# Patient Record
Sex: Female | Born: 1955 | Race: White | Hispanic: No | Marital: Single | Smoking: Current every day smoker
Health system: Southern US, Community
[De-identification: ages and names within clinical notes are randomized; demographics above are authoritative.]

## PROBLEM LIST (undated history)

## (undated) DIAGNOSIS — K5909 Other constipation: Secondary | ICD-10-CM

## (undated) DIAGNOSIS — I10 Essential (primary) hypertension: Secondary | ICD-10-CM

## (undated) HISTORY — PX: TUBAL LIGATION: SHX77

---

## 1999-09-18 ENCOUNTER — Other Ambulatory Visit: Admission: RE | Admit: 1999-09-18 | Discharge: 1999-09-18 | Payer: Self-pay | Admitting: Obstetrics and Gynecology

## 1999-09-25 ENCOUNTER — Encounter: Payer: Self-pay | Admitting: Obstetrics and Gynecology

## 1999-09-25 ENCOUNTER — Ambulatory Visit (HOSPITAL_COMMUNITY): Admission: RE | Admit: 1999-09-25 | Discharge: 1999-09-25 | Payer: Self-pay | Admitting: Obstetrics and Gynecology

## 1999-10-01 ENCOUNTER — Encounter: Admission: RE | Admit: 1999-10-01 | Discharge: 1999-10-01 | Payer: Self-pay | Admitting: Obstetrics and Gynecology

## 1999-10-01 ENCOUNTER — Encounter: Payer: Self-pay | Admitting: Obstetrics and Gynecology

## 2003-10-14 ENCOUNTER — Inpatient Hospital Stay (HOSPITAL_COMMUNITY): Admission: EM | Admit: 2003-10-14 | Discharge: 2003-10-15 | Payer: Self-pay | Admitting: Emergency Medicine

## 2006-02-12 ENCOUNTER — Encounter: Admission: RE | Admit: 2006-02-12 | Discharge: 2006-02-12 | Payer: Self-pay | Admitting: Family Medicine

## 2006-02-18 ENCOUNTER — Ambulatory Visit: Payer: Self-pay | Admitting: Gastroenterology

## 2006-02-19 ENCOUNTER — Ambulatory Visit (HOSPITAL_COMMUNITY): Admission: RE | Admit: 2006-02-19 | Discharge: 2006-02-19 | Payer: Self-pay | Admitting: Gastroenterology

## 2006-02-23 ENCOUNTER — Ambulatory Visit: Payer: Self-pay | Admitting: Gastroenterology

## 2006-02-25 ENCOUNTER — Ambulatory Visit: Payer: Self-pay | Admitting: Internal Medicine

## 2006-03-11 ENCOUNTER — Ambulatory Visit (HOSPITAL_BASED_OUTPATIENT_CLINIC_OR_DEPARTMENT_OTHER): Admission: RE | Admit: 2006-03-11 | Discharge: 2006-03-11 | Payer: Self-pay | Admitting: Urology

## 2006-03-26 ENCOUNTER — Encounter: Admission: RE | Admit: 2006-03-26 | Discharge: 2006-03-26 | Payer: Self-pay | Admitting: Family Medicine

## 2006-04-14 ENCOUNTER — Encounter: Admission: RE | Admit: 2006-04-14 | Discharge: 2006-04-14 | Payer: Self-pay | Admitting: Family Medicine

## 2006-12-28 IMAGING — CR DG THORACIC SPINE 3V
3 series · 3 of 3 positions shown · non-contrast
Comparison: None.

THORACIC SPINE - 2  VIEW:

CLINICAL DATA: Back pain

[view not recorded (1 of 3)]
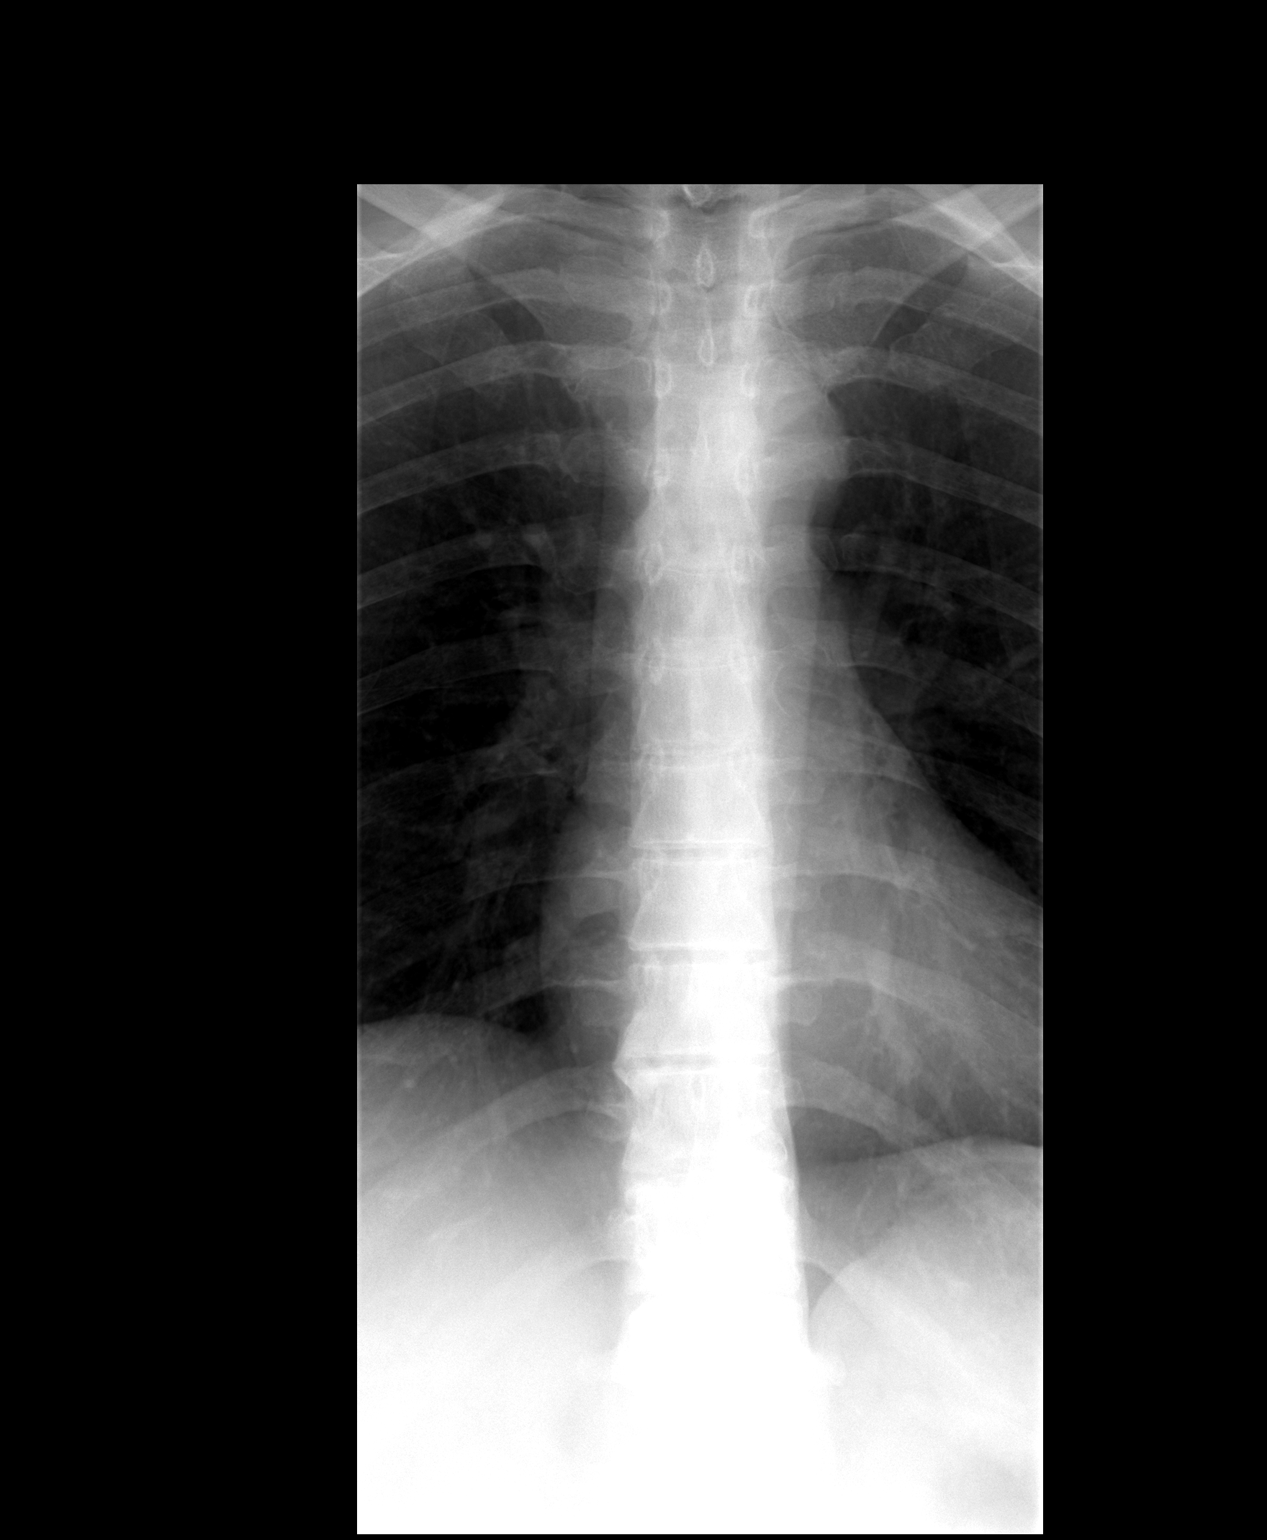

[view not recorded (2 of 3)]
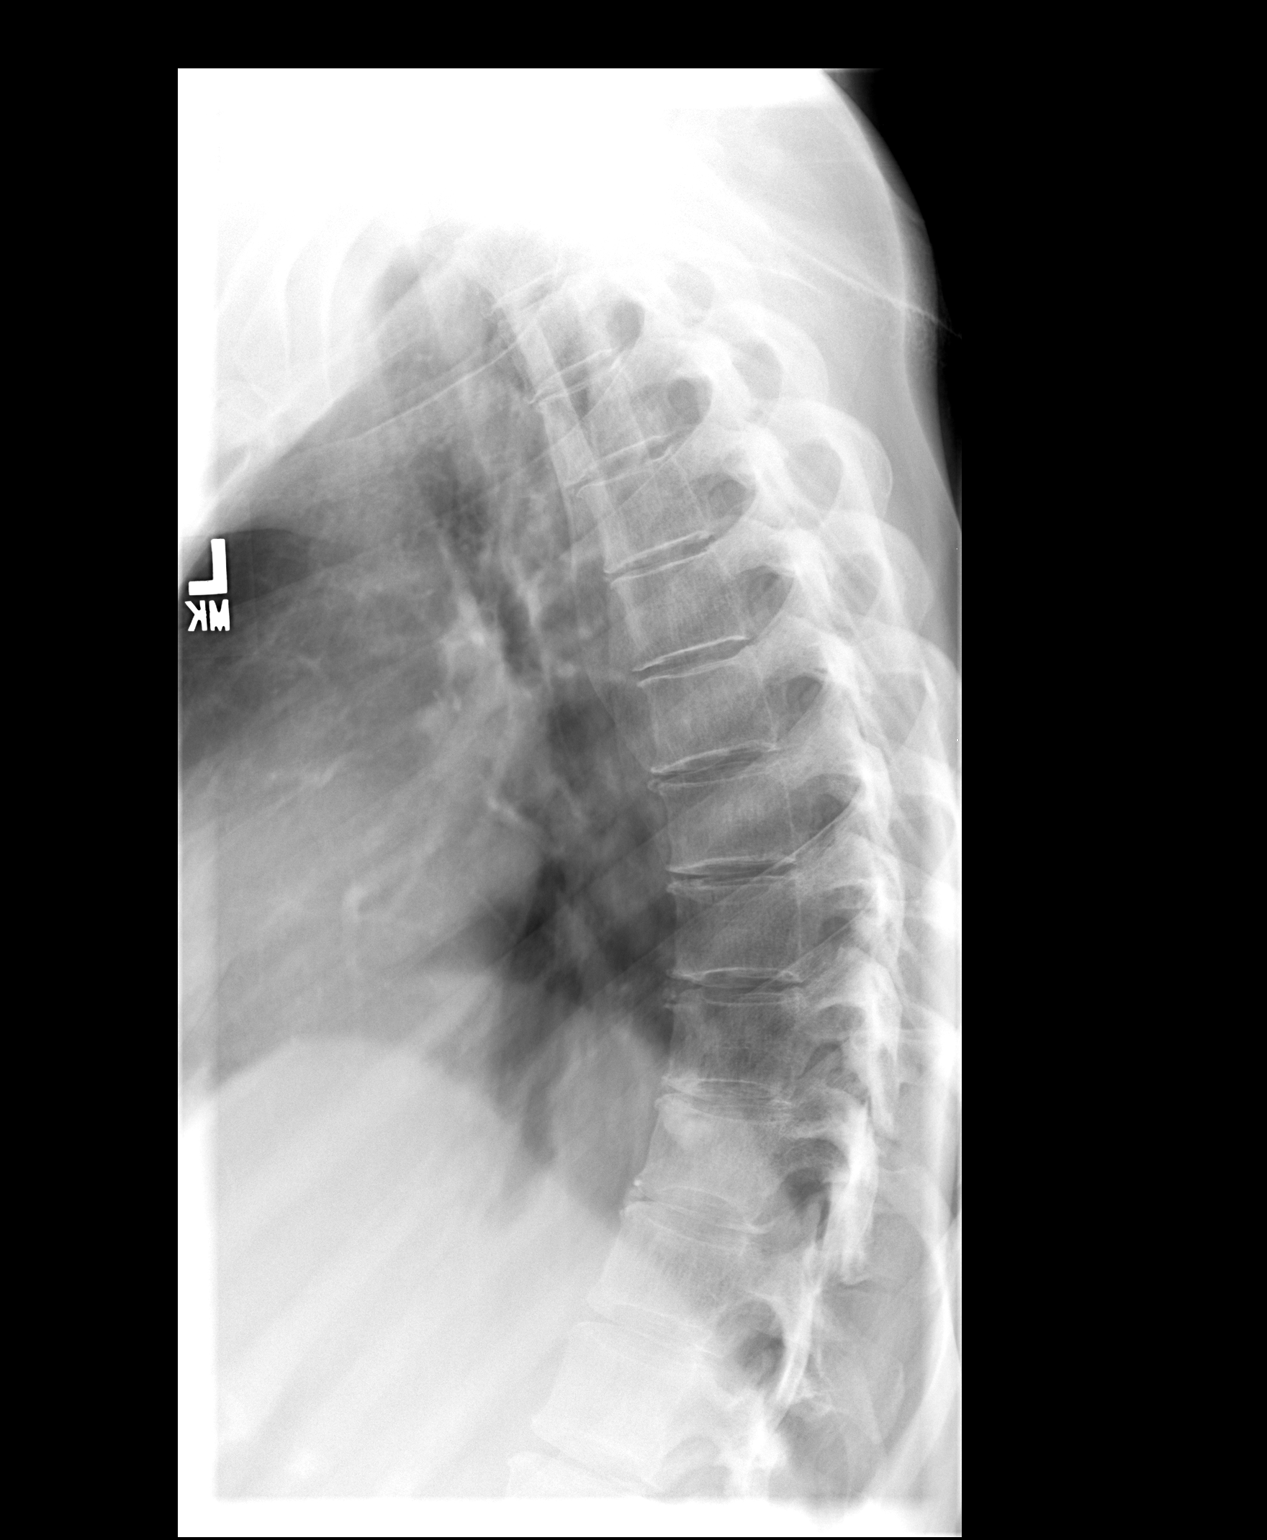

[view not recorded (3 of 3)]
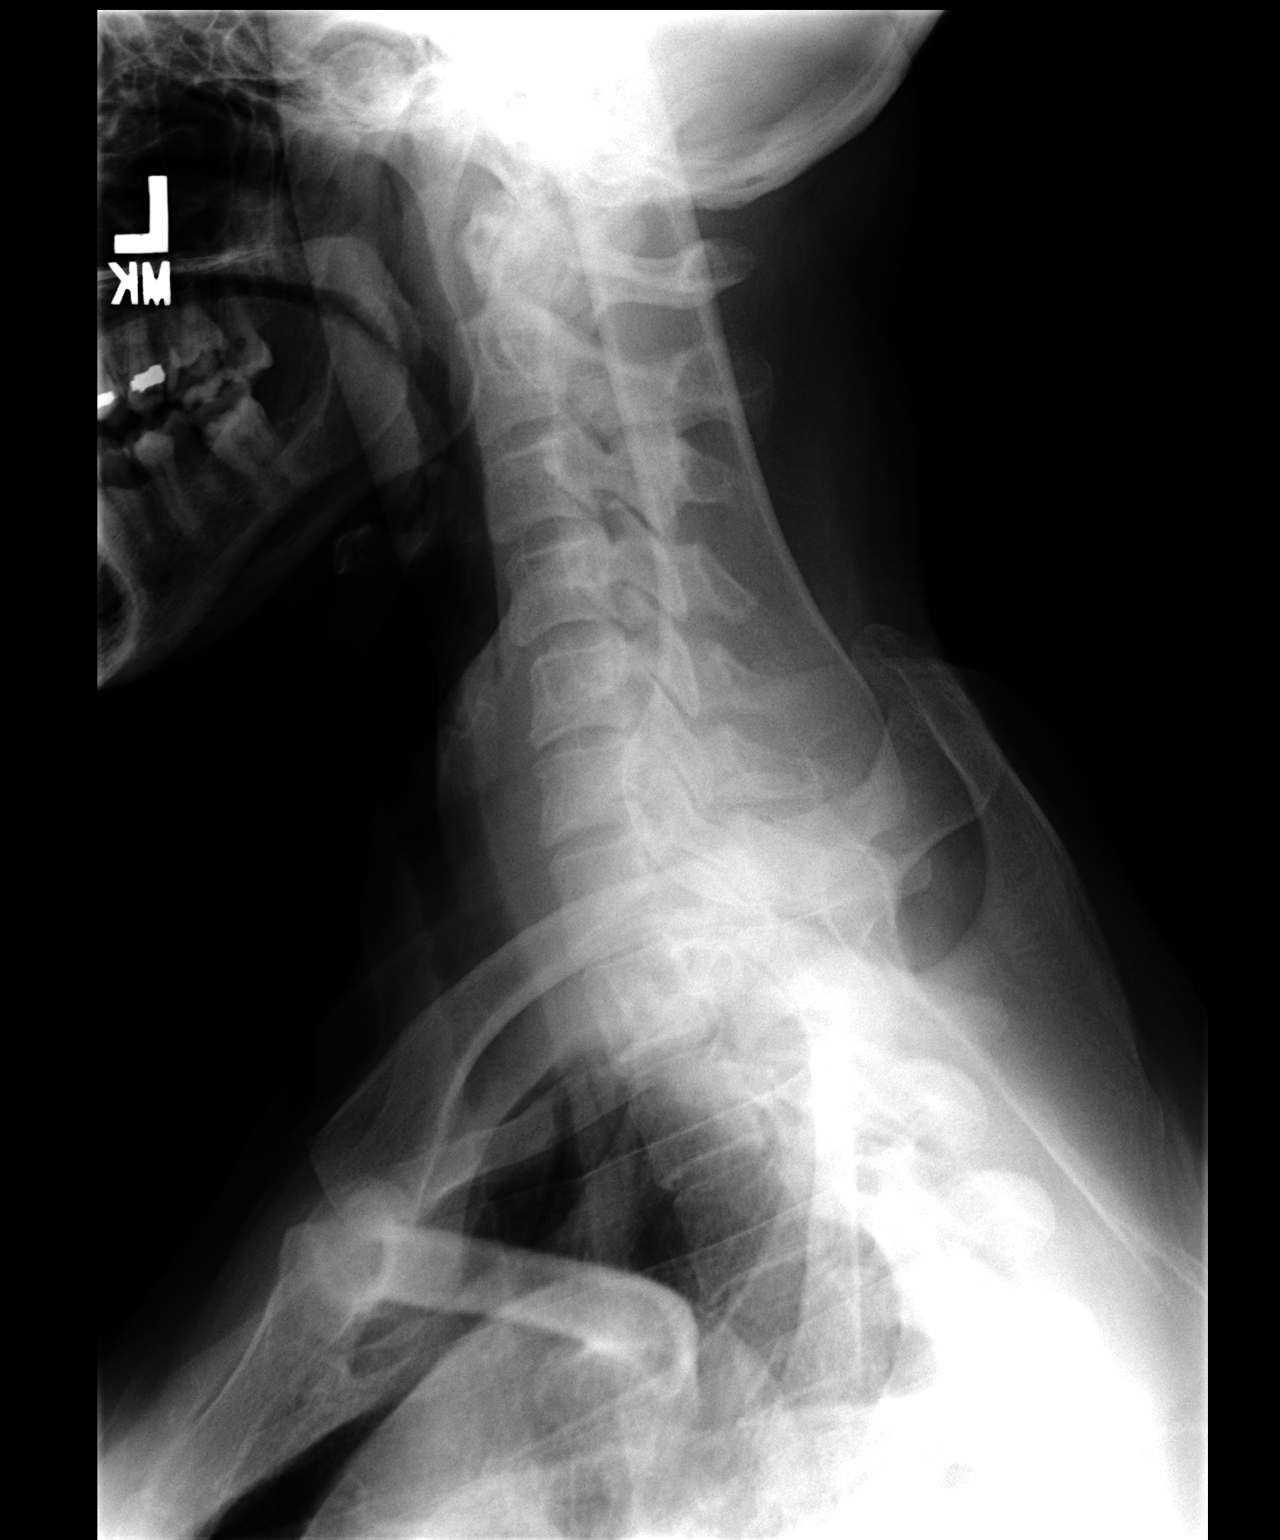

[3 of 3 positions shown; findings below may reference images not displayed]

FINDINGS: No evidence for fracture or subluxation. Intervertebral disc spaces
are preserved. Prominent antero- lateral spur noted around the T10-T11 disc
space.
IMPRESSION: No acute findings. No evidence to explain this patient's history of upper mid
back pain.

## 2010-06-01 ENCOUNTER — Encounter: Payer: Self-pay | Admitting: Family Medicine

## 2010-06-02 ENCOUNTER — Encounter: Payer: Self-pay | Admitting: Family Medicine

## 2010-06-03 ENCOUNTER — Encounter: Payer: Self-pay | Admitting: Family Medicine

## 2010-09-27 NOTE — Assessment & Plan Note (Signed)
[facility]                           GASTROENTEROLOGY OFFICE NOTE   [name]                      MRN:          [id number]  DATE:02/18/2006                            DOB:          [birth year]    REFERRING PHYSICIAN:  Windle Guard, M.D.   REASON FOR REFERRAL:  Dr. Jeannetta Nap asked me to evaluate [name]  regarding abdominal pain, weight loss.   HISTORY OF PRESENT ILLNESS:  [name] is a very pleasant 55 year old  woman who has had approximately 2 months of abdominal pains, nausea and  weight loss. The pains are always there, they are across her epigastrium.  She does have some nausea associated with it.  Lately she has been feeling  early satiety.  She also has had weight loss of approximately 25 pounds in 2  months.   She has an interesting habit of ingesting 1 tablespoonful of Epson salts  approximately once to twice a week.  This has been going on for about 6  months.  She has been doing this to help move her bowels.  She tried MiraLax  and said that did not help.  She also was a frequent user of BCs, this  stopped about 8 months ago but before that she was taking 3-4 BC powders on  a daily basis.  She also takes Excedrin Migraine, approximately 6 pills per  week, and that is currently still going on.   She had recent lab tests and imaging studies by her primary care physician.  A CBC and a CMET were all essentially normal.  She also had cortisol levels  checked and those were normal.  She had a CT scan without IV contrast of her  abdomen and that was normal.   REVIEW OF SYSTEMS:  Notable for 25 pound weight loss in the past 2 months.  The rest of the review of systems is essentially normal and available on her  nursing intake sheet.   PAST MEDICAL HISTORY:  1. Hypertension.  2. Anxiety.  3. Tubal ligation.   CURRENT MEDICINES:  Hydrochlorothiazide, alprazolam, atenolol, quetiapine,  Percocet p.r.n., Epson salts  ingesting on a once to twice weekly basis,  Excedrin Migraine taken on a 5-6 pills a week basis.   ALLERGIES:  No known drug allergies.   SOCIAL HISTORY:  Married with 2 children.  Works in a nursing home.  Smokes  almost a pack a day.   FAMILY HISTORY:  Maternal aunt with stomach cancer.  Mother with metastatic  breast cancer.  No colon cancer or colon polyps in family.   PHYSICAL EXAMINATION:  She is 5 feet 5 inches, 146 pounds.  Blood pressure  122/68, pulse 68.  CONSTITUTIONAL:  Somewhat chronically ill-appearing.  NEUROLOGIC:  Alert and oriented x3.  EYES:  Extraocular movements intact.  Mouth, oropharynx moist and no  lesions.  NECK:  Supple.  No lymphadenopathy.  LUNGS:  Clear to auscultation bilaterally.  HEART:  Regular rate and rhythm.  ABDOMEN:  Soft, mildly tender in epigastrium, nondistended, no obvious  ascites.  EXTREMITIES:  No lower extremity edema.  SKIN:  No  rashes or lesions on visible extremities.   ASSESSMENT AND PLAN:  A 55 year old woman with subacute severe abdominal  pain, weight loss.   CT scan she had was without intravenous contrast and so was somewhat  suboptimal for ruling out certain processes.  I am most struck by the fact  that she ingests Epson salts on a once or twice a week basis.  I looked this  up and this is essentially magnesium sulfate and at very high levels of  magnesium have been reported to cause death.  It is not recommended that  these be ingested orally.  I first and foremost recommend she completely  stop this, refrain from this, and I will also arrange for her to get a  complete metabolic profile with magnesium and phosphorus checked again  today.  She was a former [facility] powder abuser and still takes NSAIDs and perhaps  she has peptic ulcer disease.  I will arrange for her to have an  esophagogastroduodenoscopy done tomorrow morning to further evaluate for  this.  For her constipation, I suggest that she go back on the MiraLax,  take  it 2 to 3 scoops on a daily basis rather than the Epson salts.  I told her  in no uncertain terms that the Epson salts should never, ever be ingested  orally.  If my exam tomorrow is not very revealing, I will arrange for her  to have a repeat CT scan with intravenous and oral contrast.            ______________________________  Rachael Fee, MD      DPJ/MedQ  DD:  02/18/2006  DT:  02/18/2006  Job #:  [id number]   cc:   Windle Guard, M.D.

## 2010-09-27 NOTE — Discharge Summary (Signed)
[name], [name]                         ACCOUNT NO.:  [id number]   MEDICAL RECORD NO.:  [id number]                   PATIENT TYPE:  INP   LOCATION:  0376                                 FACILITY:  [facility]   PHYSICIAN:  Isla Pence, M.D.             DATE OF BIRTH:  1955/07/31   DATE OF ADMISSION:  10/13/2003  DATE OF DISCHARGE:  10/15/2003                                 DISCHARGE SUMMARY   DISCHARGE DIAGNOSES:  1. Polyarthralgia with pain and rash.  2. History of neck pain.  3. The patient is status post tubal ligation from prior to this admission.   DISCHARGE MEDICATIONS:  1. Doxycycline 100 mg p.o. b.i.d. for 12 days.  2. Percocet 5/325 mg one to two tablets every 4 to 6 hours p.r.n. pain.  3. The patient at home was taking hydrochlorothiazide 25 mg p.o. daily and     that may have been for episodic p.r.n. lower extremity edema and she can     continue that as needed.   DISCHARGE ACTIVITIES:  As tolerated.  The patient has been given a work note  to excuse her from work from October 14, 2003 through October 22, 2003.  She may  return to work on October 23, 2003.  She works as a Curator I believe.   DIET:  No restrictions.   FOLLOW UP:  The patient tells me that she has chosen [facility] as  a primary care site and she had tried to set up a visit with them.  She will  call them tomorrow to set up a new patient visit with one of the doctors  there in the next week.  She can also choose any of the other clinics in  town but I have advised the patient that she does need a primary care  doctor.  She will need follow up on labs that were done during this  admission primarily consisting of ANA, Lyme titer, [facility] spotted  titer, and also a GC/Chlamydia.  I am actually expecting all of these  studies to be negative at this point in time with the slight possibility  that her ANA could be minimally positive.   HOSPITAL COURSE:  This 55 year old female was  admitted secondary to the  polyarthralgia and significant pain.  In addition to the polyarthralgia and  pain, she had also noticed a rash that was primarily on the lower extremity  along the thigh.  She in addition noted that she had started out with a  pruritic rash on her forehead and had taken Benadryl and hydrocortisone  cream for this rash but Monday she noticed arm felt very painful and also  with right knee increased swelling and pain.  She also had some pain into  her left wrist.  She denied any fever but had some chills.  No nausea,  vomiting, or diarrhea.  There has been a granddaughter  that has been sick  with nausea, vomiting, and diarrhea.  She of significance had traveled to  [place], [place] but there was no known pet exposure or tick exposure.  She denies significantly about any known tick bites and has not found any  tick bites on her.  She was admitted, started on some IV fluids, and had  Lyme and [facility] spotted fever titers drawn.  These are not back yet  at this point in time.  She had significant improvement in her symptoms just  having her come in and be on Percocet's by mouth and IV fluids.  She was  started on doxycycline and ceftriaxone.  On review further, the patient  reports that the only other thing that she had done differently was she was  given a prescription, a nonsteroidal anti-inflammatory, sometime over the  past 3 or 4 weeks and she had initially taken it for about a week or two and  was doing fine with it and then had stopped it and this was primarily for  her neck pain.  She had stopped it and she had recurrence of her pain a week  prior to this admission and she restarted taking the anti-inflammatory.  It  was after that that she noticed this rash.  The rash on her face was  pruritic; however, the rash on her extremities were not.  The patient at the  time of discharge has complete resolution of her rash and it really was more  suggestive  of a maculopapular rash, it was never like an erythema multiforme  or erythema migrans type of rash.  It was truly more maculopapular.  In any  case, I went ahead and drew an ANA on her this morning to ensure that we  were not dealing with any other autoimmune type of illnesses.  The patient's  knee was x-rayed and it was entirely normal.  There was no joint effusion  and in fact her joint swelling had markedly decreased by the time she was  brought into the hospital and there is no joint effusion or knee swelling  today.  The only joint that still bothers her somewhat is the left wrist and  there is some minimal tenderness on palpation and some mild swelling.  But  the patient is wanting to be discharged very badly and clinically there is  really no reason for [place] to keep her inpatient.  We have taken her off of IV  doxycycline and IV ceftriaxone and we will keep her on p.o. doxycycline to  complete her 14-day course in case either one of her [facility] spotted  fever or her Lyme titer comes back positive.  I strongly doubt this because  the rash has completely resolved and I would have expected these not to go  away as quickly.  In addition, I do believe that we are dealing with most  likely an allergic reaction and the only thing I can think of is the recent  anti-inflammatory that she had taken and causing like a serum sickness type  of illness.  Arthralgias would go along with that story.  The patient denies  any family history of rheumatoid arthritis.  Also of significance is the  fact that her sed rate is normal at 25.  Her initial CBC had a white count  of 14.5, H&H of 13.3 and 39.3, platelet count of 297, neutrophils were 81%,  lymphocytes 11.  A repeat white count done on the day of  discharge shows a  white count of 10.1, H&H was normal at 12 and 35, platelet count of 254, and  her differential is entirely normal.  As mentioned earlier, the sed rate was normal.  Her initial sed  rate was 26.  Her rapid strep screen was negative.  Her UA was also negative including for protein.  It was positive just for  very rare trace blood but on micro was insignificant, in fact there was no  micro reported on it.  Her RPR was negative or nonreactive.  Her uric acid  was normal at 4.3.  Wet prep was negative for clue cells and Trichomonas.  Her GC/Chlamydia are still pending.  This was done just out of precaution  although she is married and had been monogamous, both she and her husband,  but because of her presentation the admitting doctor wanted to rule this  out.  But I am fairly certain that these are also going to be negative.   The patient is stable clinically for discharge and warrants no further  inpatient treatment and she is also very anxious to be discharged.  Followups as previously mentioned.                                               Jon Gills, M.D.    RRV/MEDQ  D:  10/15/2003  T:  10/15/2003  Job:  [id number]

## 2010-09-27 NOTE — Op Note (Signed)
[name], [name]             ACCOUNT NO.:  [id number]   MEDICAL RECORD NO.:  [id number]          PATIENT TYPE:  AMB   LOCATION:  [facility]                         FACILITY:  [facility]   PHYSICIAN:  Maretta Bees. Vonita Moss, M.D.DATE OF BIRTH:  [birth year]   DATE OF PROCEDURE:  03/11/2006  DATE OF DISCHARGE:                                 OPERATIVE REPORT   PREOPERATIVE DIAGNOSIS:  Right hydronephrosis.   POSTOPERATIVE DIAGNOSIS:  Right hydronephrosis.   OPERATION PERFORMED:  Cystoscopy, right retrograde pyelogram with  interpretation, right ureteroscopy, insertion of right double-J catheter.   SURGEON:  Maretta Bees. Vonita Moss, M.D.   ANESTHESIA:  General.   INDICATIONS FOR PROCEDURE:  This is a 55 year old lady who has been worked  up for abdominal and back pain and thus far her work-up has been  unremarkable and has not relieved her complaints.  She is noted to have  history of stones and she has no hematuria and no localized right flank  pain.  The CT scan showed mild fullness of the right pyelocaliceal system  and ureter down to the level of the right mid bony pelvis.  She is brought  to the operating room today for further evaluation of this hydronephrosis.   DESCRIPTION OF PROCEDURE:  The patient was brought to the operating room and  placed in lithotomy position.  External genitalia prepped and draped in the  usual fashion.  She was cystoscoped and the bladder was unremarkable with no  stones, tumors, or inflammatory lesions.  Through the cystoscope and  utilizing a cone tip catheter, a right retrograde pyelogram was obtained and  the very distal ureter appeared delicate but there was some fullness above  that and narrowing near the iliac vessels which might just be a vascular  impression.  The right upper ureter was full and the right renal pelvis was  somewhat generous in size as were a couple of the infundibula. The calices  appeared to be fairly well cupped.   I then inserted a  guidewire and over the guidewire inserted the 6 French  rigid ureteroscope.  There were no stones or intraureteral lesions seen.  Mild narrowing in the distal ureter was dilated with the scope but there was  no perforation or undue trauma in doing so.  The proximal ureter was totally  unremarkable as far as mucosa was concerned.   I felt it appropriate to leave up a double-J catheter for postoperative  discomfort and to see if it by chance would relieve her abdominal and back  pains.  I then inserted a 6 French 26 cm Polaris ureteral catheter.  There  was a full coil in the renal pelvis, confirmed radiologically and loops were  in good position in the bladder.  String was brought out per urethra and  left in place.   At this time I will leave the double-J catheter in at least for a couple of  days and have given the patient Levaquin and Detrol LA.  She does have  Percocet at home.  The patient tolerated the procedure well.      Maretta Bees. Vonita Moss,  M.D.  Electronically Signed     LJP/MEDQ  D:  03/11/2006  T:  03/11/2006  Job:  [id number]   cc:   Rachael Fee, MD  [address]  [place], [place] [address]

## 2011-11-28 ENCOUNTER — Emergency Department (HOSPITAL_COMMUNITY): Payer: PRIVATE HEALTH INSURANCE

## 2011-11-28 ENCOUNTER — Encounter (HOSPITAL_COMMUNITY): Payer: Self-pay | Admitting: *Deleted

## 2011-11-28 ENCOUNTER — Inpatient Hospital Stay (HOSPITAL_COMMUNITY)
Admission: EM | Admit: 2011-11-28 | Discharge: 2011-12-01 | DRG: 390 | Disposition: A | Discharge status: Home / Self Care | Payer: PRIVATE HEALTH INSURANCE | Attending: Internal Medicine | Admitting: Internal Medicine

## 2011-11-28 ENCOUNTER — Inpatient Hospital Stay (HOSPITAL_COMMUNITY): Payer: PRIVATE HEALTH INSURANCE

## 2011-11-28 DIAGNOSIS — Z8711 Personal history of peptic ulcer disease: Secondary | ICD-10-CM

## 2011-11-28 DIAGNOSIS — G8929 Other chronic pain: Secondary | ICD-10-CM | POA: Diagnosis present

## 2011-11-28 DIAGNOSIS — K5909 Other constipation: Secondary | ICD-10-CM | POA: Diagnosis present

## 2011-11-28 DIAGNOSIS — Z79899 Other long term (current) drug therapy: Secondary | ICD-10-CM

## 2011-11-28 DIAGNOSIS — F1920 Other psychoactive substance dependence, uncomplicated: Secondary | ICD-10-CM | POA: Diagnosis present

## 2011-11-28 DIAGNOSIS — R109 Unspecified abdominal pain: Secondary | ICD-10-CM

## 2011-11-28 DIAGNOSIS — K5900 Constipation, unspecified: Secondary | ICD-10-CM | POA: Diagnosis present

## 2011-11-28 DIAGNOSIS — F17200 Nicotine dependence, unspecified, uncomplicated: Secondary | ICD-10-CM | POA: Diagnosis present

## 2011-11-28 DIAGNOSIS — K56600 Partial intestinal obstruction, unspecified as to cause: Secondary | ICD-10-CM

## 2011-11-28 DIAGNOSIS — K56609 Unspecified intestinal obstruction, unspecified as to partial versus complete obstruction: Principal | ICD-10-CM | POA: Diagnosis present

## 2011-11-28 DIAGNOSIS — A088 Other specified intestinal infections: Secondary | ICD-10-CM

## 2011-11-28 DIAGNOSIS — M545 Low back pain, unspecified: Secondary | ICD-10-CM | POA: Diagnosis present

## 2011-11-28 DIAGNOSIS — I10 Essential (primary) hypertension: Secondary | ICD-10-CM | POA: Diagnosis present

## 2011-11-28 HISTORY — DX: Other constipation: K59.09

## 2011-11-28 HISTORY — DX: Essential (primary) hypertension: I10

## 2011-11-28 LAB — CARDIAC PANEL(CRET KIN+CKTOT+MB+TROPI)
CK, MB: 1.4 ng/mL (ref 0.3–4.0)
Relative Index: INVALID (ref 0.0–2.5)
Total CK: 26 U/L (ref 7–177)
Troponin I: <0.3 ng/mL (ref <0.30)

## 2011-11-28 LAB — COMPREHENSIVE METABOLIC PANEL
AST: 13 U/L (ref 0–37)
Alkaline Phosphatase: 103 U/L (ref 39–117)
CO2: 26 mEq/L (ref 19–32)
Calcium: 9.7 mg/dL (ref 8.4–10.5)
Creatinine, Ser: 0.73 mg/dL (ref 0.50–1.10)
GFR calc Af Amer: >90 mL/min (ref >90)
Glucose, Bld: 122 mg/dL — ABNORMAL HIGH (ref 70–99)
Total Bilirubin: 0.4 mg/dL (ref 0.3–1.2)

## 2011-11-28 LAB — CBC WITH DIFFERENTIAL/PLATELET
Basophils Absolute: 0 10*3/uL (ref 0.0–0.1)
Eosinophils Relative: 2 % (ref 0–5)
Lymphocytes Relative: 8 % — ABNORMAL LOW (ref 12–46)
Lymphs Abs: 0.9 10*3/uL (ref 0.7–4.0)
MCHC: 34.1 g/dL (ref 30.0–36.0)
Monocytes Absolute: 0.7 10*3/uL (ref 0.1–1.0)
Neutro Abs: 9.1 10*3/uL — ABNORMAL HIGH (ref 1.7–7.7)
Platelets: 232 10*3/uL (ref 150–400)
RDW: 13.8 % (ref 11.5–15.5)
WBC: 10.9 10*3/uL — ABNORMAL HIGH (ref 4.0–10.5)

## 2011-11-28 LAB — OCCULT BLOOD, POC DEVICE: Fecal Occult Bld: POSITIVE

## 2011-11-28 MED ORDER — SODIUM CHLORIDE 0.9 % IV SOLN
INTRAVENOUS | Status: DC
  Administered 2011-11-28 – 2011-11-29 (×2): via INTRAVENOUS
  Administered 2011-11-30: 75 mL/h via INTRAVENOUS
  Administered 2011-11-30 – 2011-12-01 (×2): via INTRAVENOUS

## 2011-11-28 MED ORDER — ENOXAPARIN SODIUM 40 MG/0.4ML ~~LOC~~ SOLN
40.0000 mg | SUBCUTANEOUS | Status: DC
  Administered 2011-11-28 – 2011-11-29 (×2): 40 mg via SUBCUTANEOUS
  Filled 2011-11-28 (×4): qty 0.4

## 2011-11-28 MED ORDER — PROMETHAZINE HCL 25 MG PO TABS: 12.5000 mg | ORAL_TABLET | Freq: Four times a day (QID) | ORAL | Status: DC | PRN

## 2011-11-28 MED ORDER — NICOTINE 14 MG/24HR TD PT24
14.0000 mg | MEDICATED_PATCH | Freq: Every day | TRANSDERMAL | Status: DC
  Filled 2011-11-28 (×4): qty 1

## 2011-11-28 MED ORDER — ACETAMINOPHEN 650 MG RE SUPP: 650.0000 mg | Freq: Four times a day (QID) | RECTAL | Status: DC | PRN

## 2011-11-28 MED ORDER — ONDANSETRON HCL 4 MG/2ML IJ SOLN
4.0000 mg | Freq: Three times a day (TID) | INTRAMUSCULAR | Status: AC | PRN
  Administered 2011-11-28 – 2011-11-29 (×2): 4 mg via INTRAVENOUS
  Filled 2011-11-28 (×2): qty 2

## 2011-11-28 MED ORDER — MORPHINE SULFATE 4 MG/ML IJ SOLN
4.0000 mg | Freq: Once | INTRAMUSCULAR | Status: AC
  Administered 2011-11-28: 4 mg via INTRAVENOUS
  Filled 2011-11-28: qty 1

## 2011-11-28 MED ORDER — IOHEXOL 300 MG/ML  SOLN
100.0000 mL | Freq: Once | INTRAMUSCULAR | Status: AC | PRN
  Administered 2011-11-28: 80 mL via INTRAVENOUS

## 2011-11-28 MED ORDER — SODIUM CHLORIDE 0.9 % IV SOLN
8.0000 mg/h | INTRAVENOUS | Status: DC
  Administered 2011-11-28 – 2011-12-01 (×4): 8 mg/h via INTRAVENOUS
  Filled 2011-11-28 (×9): qty 80

## 2011-11-28 MED ORDER — SODIUM CHLORIDE 0.9 % IV SOLN
INTRAVENOUS | Status: DC
  Administered 2011-11-28 (×2): via INTRAVENOUS

## 2011-11-28 MED ORDER — ONDANSETRON 8 MG PO TBDP
ORAL_TABLET | ORAL | Status: AC
  Filled 2011-11-28: qty 1

## 2011-11-28 MED ORDER — BISACODYL 10 MG RE SUPP: 10.0000 mg | Freq: Once | RECTAL | Status: DC

## 2011-11-28 MED ORDER — ACETAMINOPHEN 325 MG PO TABS: 650.0000 mg | ORAL_TABLET | Freq: Four times a day (QID) | ORAL | Status: DC | PRN

## 2011-11-28 MED ORDER — LEVALBUTEROL HCL 0.63 MG/3ML IN NEBU
0.6300 mg | INHALATION_SOLUTION | Freq: Four times a day (QID) | RESPIRATORY_TRACT | Status: DC | PRN
  Filled 2011-11-28: qty 3

## 2011-11-28 MED ORDER — HYDROMORPHONE HCL PF 1 MG/ML IJ SOLN
1.0000 mg | INTRAMUSCULAR | Status: DC | PRN
  Administered 2011-11-28: 1 mg via INTRAVENOUS
  Filled 2011-11-28: qty 1

## 2011-11-28 MED ORDER — HYDROMORPHONE HCL PF 1 MG/ML IJ SOLN
1.0000 mg | Freq: Three times a day (TID) | INTRAMUSCULAR | Status: DC | PRN
  Administered 2011-11-28: 1 mg via INTRAVENOUS
  Filled 2011-11-28: qty 1

## 2011-11-28 MED ORDER — PROMETHAZINE HCL 25 MG/ML IJ SOLN
12.5000 mg | Freq: Once | INTRAMUSCULAR | Status: AC
  Administered 2011-11-28: 12.5 mg via INTRAVENOUS
  Filled 2011-11-28: qty 1

## 2011-11-28 MED ORDER — ONDANSETRON 8 MG PO TBDP
8.0000 mg | ORAL_TABLET | Freq: Once | ORAL | Status: AC
  Administered 2011-11-28: 8 mg via ORAL

## 2011-11-28 MED ORDER — FLEET ENEMA 7-19 GM/118ML RE ENEM: 1.0000 | ENEMA | Freq: Once | RECTAL | Status: AC | PRN

## 2011-11-28 NOTE — ED Notes (Signed)
RN to obtain labs with start of IV 

## 2011-11-28 NOTE — ED Notes (Signed)
Pt reports constipation x 2.5 weeks. Used enema last night, but only had clear liquid come out. Reports acid reflux, vomiting since last night-brownish/green color. No hx of SBO.

## 2011-11-28 NOTE — H&P (Signed)
[name] MRN: [id number] DOB/AGE: 1956-02-21 56 y.o. Primary Care Physician:No primary provider on file. Admit date: 11/28/2011 Chief Complaint:Constipation  .  Nausea  HPI: 56 year old female complaining of abdominal pain that started last night and constipation for 2-1/2 weeks. States that she try to use an enema last night with clear return.She is on chronic narcotics and is on dependent on dulcolax. She normally has a BM every 2 days . States that she has vomited several times in the past 12 hours.  States that she was eating and drinking until yesterday when it got worse. States that she vomited x4 today. She had dry heaving and also reports black tarry stools . She reports a 40 lbs weight loss , which daughter attributes to depression and poor PO intake . States that yesterday she had one small lose BM in response to the dulcolax supp.   She has gone to the GI Dr. Christella Hartigan 2007. She has never had a colonoscopy   Past Medical History  Diagnosis Date  . Hypertension     History reviewed. No pertinent past surgical history.  Prior to Admission medications   Medication Sig Start Date End Date Taking? Authorizing Provider  cloNIDine (CATAPRES) 0.2 MG tablet Take 0.2 mg by mouth 2 (two) times daily.   Yes Historical Provider, MD  HYDROcodone-acetaminophen (NORCO) 7.5-325 MG per tablet Take 1 tablet by mouth every 6 (six) hours as needed.   Yes Historical Provider, MD  QUEtiapine (SEROQUEL) 100 MG tablet Take 100 mg by mouth at bedtime as needed.   Yes Historical Provider, MD    Allergies: No Known Allergies  FAMILY HISTORY: Maternal aunt with stomach cancer. Mother with metastatic  breast cancer. No colon cancer or colon polyps in family.   Social History:  reports that she has been smoking.  She does not have any smokeless tobacco history on file. She reports that she does not drink alcohol or use illicit drugs.  ROS: Constitutional: Positive for fatigue. Negative for fever,  chills and diaphoresis.  HENT: Negative.  Eyes: Negative.  Respiratory: Negative. Negative for shortness of breath.  Cardiovascular: Negative. Negative for chest pain.  Gastrointestinal: Positive for nausea, vomiting, abdominal pain, diarrhea and constipation. Negative for blood in stool, abdominal distention and rectal pain.  Genitourinary: Negative for hematuria, vaginal bleeding and vaginal discharge.  Musculoskeletal: Positive for back pain.  Neurological: Negative.  Psychiatric/Behavioral: Negative.  All other systems reviewed and are negative   PHYSICAL EXAM: Blood pressure 162/87, pulse 68, temperature 97.9 F (36.6 C), temperature source Oral, resp. rate 17, height 5\' 5"  (1.651 m), weight 63.504 kg (140 lb), SpO2 97.00%. Constitutional: She is oriented to person, place, and time. She appears well-developed and well-nourished.  HENT:  Head: Normocephalic and atraumatic.  Eyes: Conjunctivae and EOM are normal. Pupils are equal, round, and reactive to light.  Neck: Normal range of motion. Neck supple.  Cardiovascular: Normal rate.  Pulmonary/Chest: Effort normal.  Abdominal: Soft. She exhibits distension. She exhibits no mass. There is tenderness. There is no rebound and no guarding.  Stool felt with rectal exam high up.  Musculoskeletal: Normal range of motion. She exhibits no edema and no tenderness.  Neurological: She is alert and oriented to person, place, and time. She has normal reflexes.  Skin: Skin is warm and dry.  Psychiatric: She has a normal mood and affect   No results found for this or any previous visit (from the past 240 hour(s)).   Results for orders placed during the  hospital encounter of 11/28/11 (from the past 48 hour(s))  OCCULT BLOOD, POC DEVICE     Status: Normal   Collection Time   11/28/11 12:44 PM      Component Value Range Comment   Fecal Occult Bld POSITIVE     CBC WITH DIFFERENTIAL     Status: Abnormal   Collection Time   11/28/11 12:53 PM       Component Value Range Comment   WBC 10.9 (*) 4.0 - 10.5 K/uL    RBC 5.11  3.87 - 5.11 MIL/uL    Hemoglobin 16.2 (*) 12.0 - 15.0 g/dL    HCT 96.0 (*) 45.4 - 46.0 %    MCV 93.0  78.0 - 100.0 fL    MCH 31.7  26.0 - 34.0 pg    MCHC 34.1  30.0 - 36.0 g/dL    RDW 09.8  11.9 - 14.7 %    Platelets 232  150 - 400 K/uL    Neutrophils Relative 84 (*) 43 - 77 %    Neutro Abs 9.1 (*) 1.7 - 7.7 K/uL    Lymphocytes Relative 8 (*) 12 - 46 %    Lymphs Abs 0.9  0.7 - 4.0 K/uL    Monocytes Relative 6  3 - 12 %    Monocytes Absolute 0.7  0.1 - 1.0 K/uL    Eosinophils Relative 2  0 - 5 %    Eosinophils Absolute 0.2  0.0 - 0.7 K/uL    Basophils Relative 0  0 - 1 %    Basophils Absolute 0.0  0.0 - 0.1 K/uL   COMPREHENSIVE METABOLIC PANEL     Status: Abnormal   Collection Time   11/28/11 12:53 PM      Component Value Range Comment   Sodium 140  135 - 145 mEq/L    Potassium 3.8  3.5 - 5.1 mEq/L    Chloride 102  96 - 112 mEq/L    CO2 26  19 - 32 mEq/L    Glucose, Bld 122 (*) 70 - 99 mg/dL    BUN 20  6 - 23 mg/dL    Creatinine, Ser 8.29  0.50 - 1.10 mg/dL    Calcium 9.7  8.4 - 56.2 mg/dL    Total Protein 7.3  6.0 - 8.3 g/dL    Albumin 4.1  3.5 - 5.2 g/dL    AST 13  0 - 37 U/L    ALT 6  0 - 35 U/L    Alkaline Phosphatase 103  39 - 117 U/L    Total Bilirubin 0.4  0.3 - 1.2 mg/dL    GFR calc non Af Amer >90  >90 mL/min    GFR calc Af Amer >90  >90 mL/min   LIPASE, BLOOD     Status: Normal   Collection Time   11/28/11 12:53 PM      Component Value Range Comment   Lipase 20  11 - 59 U/L     Ct Abdomen Pelvis W Contrast  11/28/2011  *RADIOLOGY REPORT*  Clinical Data: Mid abdominal pain, small bowel obstruction  CT ABDOMEN AND PELVIS WITH CONTRAST  Technique:  Multidetector CT imaging of the abdomen and pelvis was performed following the standard protocol during bolus administration of intravenous contrast. Sagittal and coronal MPR images reconstructed from axial data set.  Contrast: 80mL OMNIPAQUE  IOHEXOL 300 MG/ML  SOLN Dilute oral contrast.  Comparison: 02/25/2006  Findings: Lung bases clear. Minimal focal fatty infiltration of liver adjacent the  falciform fissure. Minimal central intrahepatic biliary prominence similar to previous exam. Liver, spleen, pancreas, kidneys, and adrenal glands normal appearance. Unremarkable bladder, ureters, uterus, and adnexae. Appendix not definitely visualized but no pericecal inflammatory process noted.  Proximal small bowel loops are larger than distal small bowel loops with a subtle zone of transition to normal caliber in the central pelvis. Stomach and colon normal appearance. No definite bowel wall thickening, mass, or hernia. Tiny amount of free pelvic fluid, potentially physiologic. Scattered atherosclerotic calcifications aorta and iliac arteries. No acute osseous findings.  IMPRESSION: Mildly dilated proximal and decompressed distal small bowel loops with a subtle zone of transition in the central pelvis suspicious for a degree of small bowel obstruction. This is of uncertain etiology. Nonvisualization of the appendix. No other significant intra abdominal or intrapelvic abnormalities.  Original Report Authenticated By: Lollie Marrow, M.D.   Dg Abd Acute W/chest  11/28/2011  *RADIOLOGY REPORT*  Clinical Data: Vomiting, mid abdominal pain, constipation, smoking history  ACUTE ABDOMEN SERIES (ABDOMEN 2 VIEW & CHEST 1 VIEW)  Comparison: CT abdomen pelvis of 02/25/2006, and chest x-ray of 03/26/2006  Findings: The lungs are clear.  Mediastinal contours appear stable. The heart is within normal limits in size.  No bony abnormality is seen.  Supine and erect views of the abdomen show dilatation of small bowel loops with scattered air-fluid levels, suspicious for partial small bowel obstruction.  Only a small amount of colonic bowel gas is seen with feces throughout the colon.  No free air is noted on the erect view.  No opaque calculi are seen.  IMPRESSION:  1.   Suspect partial small bowel obstruction.  Consider CT of the abdomen and pelvis with IV contrast media.  No free air. 2.  No active lung disease.  Original Report Authenticated By: Juline Patch, M.D.    Impression:  Active Problems:  Partial small bowel obstruction DUE TO FUNCTIONAL OBSTRUCTION/viral gastroeneteritis vs mechanical obstruction in the setting of weight loss/ positive guiac   HX OF PUD  narocotic dependence     Plan: CONSERVATIVE rx NGT to suction Will try PRN suppositiories PPI IV  Serial cbc Surgery GI consult in AM if not improved       Danaya Geddis 11/28/2011, 9:33 PM

## 2011-11-28 NOTE — ED Provider Notes (Signed)
History     CSN: [id number]  Arrival date & time 11/28/11  1119   First MD Initiated Contact with Patient 11/28/11 1254      Chief Complaint  Patient presents with  . Constipation  . Nausea    (Consider location/radiation/quality/duration/timing/severity/associated sxs/prior treatment) Patient is a 56 y.o. female presenting with abdominal pain. The history is provided by the patient. No language interpreter was used.  Abdominal Pain The primary symptoms of the illness include abdominal pain, fatigue, nausea, vomiting and diarrhea. The primary symptoms of the illness do not include fever, shortness of breath, vaginal discharge or vaginal bleeding. The current episode started more than 2 days ago. The onset of the illness was gradual. The problem has been gradually worsening.  The patient states that she believes she is currently not pregnant. The patient has had a change in bowel habit. Additional symptoms associated with the illness include constipation and back pain. Symptoms associated with the illness do not include chills, diaphoresis or hematuria. Significant associated medical issues do not include diabetes.   56 year old female complaining of abdominal pain that started last night and constipation for 2-1/2 weeks. States that she try to use an enema last night with clear return. States that she has vomited several times in the past 12 hours. Laxatives and it locks for 4 days. States that she was eating and drinking until yesterday when it got worse. States that she vomited x4 today. States that she had GERD through the night and used home with no relief. States that yesterday she did have some diarrhea. This to Dr. Shelah Lewandowsky.  She has gone to the GI Dr. Christella Hartigan  2007.  Past Medical History  Diagnosis Date  . Hypertension     History reviewed. No pertinent past surgical history.  No family history on file.  History  Substance Use Topics  . Smoking status: Current Everyday Smoker  .  Smokeless tobacco: Not on file  . Alcohol Use: No    OB History    Grav Para Term Preterm Abortions TAB SAB Ect Mult Living                  Review of Systems  Constitutional: Positive for fatigue. Negative for fever, chills and diaphoresis.  HENT: Negative.   Eyes: Negative.   Respiratory: Negative.  Negative for shortness of breath.   Cardiovascular: Negative.  Negative for chest pain.  Gastrointestinal: Positive for nausea, vomiting, abdominal pain, diarrhea and constipation. Negative for blood in stool, abdominal distention and rectal pain.  Genitourinary: Negative for hematuria, vaginal bleeding and vaginal discharge.  Musculoskeletal: Positive for back pain.  Neurological: Negative.   Psychiatric/Behavioral: Negative.   All other systems reviewed and are negative.    Allergies  Review of patient's allergies indicates no known allergies.  Home Medications   Current Outpatient Rx  Name Route Sig Dispense Refill  . CLONIDINE HCL 0.2 MG PO TABS Oral Take 0.2 mg by mouth 2 (two) times daily.    Marland Kitchen HYDROCODONE-ACETAMINOPHEN 7.5-325 MG PO TABS Oral Take 1 tablet by mouth every 6 (six) hours as needed.    Marland Kitchen QUETIAPINE FUMARATE 100 MG PO TABS Oral Take 100 mg by mouth at bedtime as needed.      BP 148/78  Pulse 99  Temp 98 F (36.7 C) (Oral)  Resp 16  Ht 5\' 5"  (1.651 m)  Wt 140 lb (63.504 kg)  BMI 23.30 kg/m2  SpO2 98%  Physical Exam  Nursing note and vitals reviewed.  Constitutional: She is oriented to person, place, and time. She appears well-developed and well-nourished.  HENT:  Head: Normocephalic and atraumatic.  Eyes: Conjunctivae and EOM are normal. Pupils are equal, round, and reactive to light.  Neck: Normal range of motion. Neck supple.  Cardiovascular: Normal rate.   Pulmonary/Chest: Effort normal.  Abdominal: Soft. She exhibits distension. She exhibits no mass. There is tenderness. There is no rebound and no guarding.       Stool felt with rectal exam  high up.    Musculoskeletal: Normal range of motion. She exhibits no edema and no tenderness.  Neurological: She is alert and oriented to person, place, and time. She has normal reflexes.  Skin: Skin is warm and dry.  Psychiatric: She has a normal mood and affect.    ED Course  Procedures (including critical care time)  Labs Reviewed  CBC WITH DIFFERENTIAL - Abnormal; Notable for the following:    WBC 10.9 (*)     Hemoglobin 16.2 (*)     HCT 47.5 (*)     Neutrophils Relative 84 (*)     Neutro Abs 9.1 (*)     Lymphocytes Relative 8 (*)     All other components within normal limits  COMPREHENSIVE METABOLIC PANEL - Abnormal; Notable for the following:    Glucose, Bld 122 (*)     All other components within normal limits  OCCULT BLOOD, POC DEVICE  OCCULT BLOOD X 1 CARD TO LAB, STOOL   Dg Abd Acute W/chest  11/28/2011  *RADIOLOGY REPORT*  Clinical Data: Vomiting, mid abdominal pain, constipation, smoking history  ACUTE ABDOMEN SERIES (ABDOMEN 2 VIEW & CHEST 1 VIEW)  Comparison: CT abdomen pelvis of 02/25/2006, and chest x-ray of 03/26/2006  Findings: The lungs are clear.  Mediastinal contours appear stable. The heart is within normal limits in size.  No bony abnormality is seen.  Supine and erect views of the abdomen show dilatation of small bowel loops with scattered air-fluid levels, suspicious for partial small bowel obstruction.  Only a small amount of colonic bowel gas is seen with feces throughout the colon.  No free air is noted on the erect view.  No opaque calculi are seen.  IMPRESSION:  1.  Suspect partial small bowel obstruction.  Consider CT of the abdomen and pelvis with IV contrast media.  No free air. 2.  No active lung disease.  Original Report Authenticated By: Juline Patch, M.D.     No diagnosis found.    MDM  55 year old female will be admitted by hospitalist team for for partial small bowel obstruction. And she started in the ER. Patient receiving Dilaudid for pain  and Zofran for nausea.white count 10 point. Positive black stool with a hemoglobin of 16.2. CT of the abdomen shows mildly dilated proximal bowel loops with a subtle zone of transition in the xcentral pelvis suspicious for a small bowel obstruction.    Labs Reviewed  CBC WITH DIFFERENTIAL - Abnormal; Notable for the following:    WBC 10.9 (*)     Hemoglobin 16.2 (*)     HCT 47.5 (*)     Neutrophils Relative 84 (*)     Neutro Abs 9.1 (*)     Lymphocytes Relative 8 (*)     All other components within normal limits  COMPREHENSIVE METABOLIC PANEL - Abnormal; Notable for the following:    Glucose, Bld 122 (*)     All other components within normal limits  OCCULT BLOOD, POC DEVICE  OCCULT BLOOD X 1 CARD TO LAB, STOOL          Remi Haggard, NP 11/28/11 1759

## 2011-11-29 ENCOUNTER — Inpatient Hospital Stay (HOSPITAL_COMMUNITY): Payer: PRIVATE HEALTH INSURANCE

## 2011-11-29 ENCOUNTER — Encounter (INDEPENDENT_AMBULATORY_CARE_PROVIDER_SITE_OTHER): Payer: Self-pay | Admitting: General Surgery

## 2011-11-29 DIAGNOSIS — K56609 Unspecified intestinal obstruction, unspecified as to partial versus complete obstruction: Principal | ICD-10-CM

## 2011-11-29 LAB — TSH: TSH: 0.322 u[IU]/mL — ABNORMAL LOW (ref 0.350–4.500)

## 2011-11-29 LAB — CBC
Hemoglobin: 13.9 g/dL (ref 12.0–15.0)
MCH: 30.9 pg (ref 26.0–34.0)
MCHC: 32.7 g/dL (ref 30.0–36.0)
Platelets: 198 10*3/uL (ref 150–400)
RBC: 4.5 MIL/uL (ref 3.87–5.11)

## 2011-11-29 LAB — COMPREHENSIVE METABOLIC PANEL
CO2: 26 mEq/L (ref 19–32)
Calcium: 8.8 mg/dL (ref 8.4–10.5)
Chloride: 105 mEq/L (ref 96–112)
Creatinine, Ser: 0.66 mg/dL (ref 0.50–1.10)
GFR calc Af Amer: >90 mL/min (ref >90)
GFR calc non Af Amer: >90 mL/min (ref >90)
Glucose, Bld: 90 mg/dL (ref 70–99)
Total Bilirubin: 0.5 mg/dL (ref 0.3–1.2)

## 2011-11-29 LAB — CARDIAC PANEL(CRET KIN+CKTOT+MB+TROPI)
CK, MB: 1.7 ng/mL (ref 0.3–4.0)
Relative Index: INVALID (ref 0.0–2.5)
Total CK: 30 U/L (ref 7–177)
Total CK: 42 U/L (ref 7–177)

## 2011-11-29 MED ORDER — SORBITOL 70 % SOLN
960.0000 mL | TOPICAL_OIL | Freq: Once | ORAL | Status: AC
  Administered 2011-11-30: 960 mL via RECTAL
  Filled 2011-11-29 (×2): qty 240

## 2011-11-29 MED ORDER — FENTANYL 25 MCG/HR TD PT72
25.0000 ug | MEDICATED_PATCH | TRANSDERMAL | Status: DC
  Administered 2011-11-29: 25 ug via TRANSDERMAL
  Filled 2011-11-29: qty 1

## 2011-11-29 MED ORDER — HYDROMORPHONE HCL PF 1 MG/ML IJ SOLN
0.5000 mg | INTRAMUSCULAR | Status: DC | PRN
  Administered 2011-11-29 (×2): 1 mg via INTRAVENOUS
  Filled 2011-11-29 (×2): qty 1

## 2011-11-29 MED ORDER — HYDROMORPHONE HCL PF 2 MG/ML IJ SOLN
2.0000 mg | Freq: Once | INTRAMUSCULAR | Status: AC
  Administered 2011-11-29: 2 mg via INTRAVENOUS
  Filled 2011-11-29: qty 1

## 2011-11-29 MED ORDER — SORBITOL 70 % SOLN
960.0000 mL | TOPICAL_OIL | Freq: Once | ORAL | Status: AC
  Administered 2011-11-29: 960 mL via RECTAL
  Filled 2011-11-29: qty 240

## 2011-11-29 MED ORDER — HYDROMORPHONE HCL PF 1 MG/ML IJ SOLN
1.0000 mg | INTRAMUSCULAR | Status: DC | PRN
  Administered 2011-11-29: 2 mg via INTRAVENOUS
  Administered 2011-11-29: 1 mg via INTRAVENOUS
  Administered 2011-11-30 – 2011-12-01 (×8): 2 mg via INTRAVENOUS
  Filled 2011-11-29 (×6): qty 2
  Filled 2011-11-29: qty 1
  Filled 2011-11-29 (×4): qty 2

## 2011-11-29 MED ORDER — QUETIAPINE FUMARATE 200 MG PO TABS
200.0000 mg | ORAL_TABLET | Freq: Every day | ORAL | Status: DC
  Administered 2011-11-29: 200 mg via ORAL
  Filled 2011-11-29 (×3): qty 1

## 2011-11-29 NOTE — Progress Notes (Signed)
[facility] PROGRESS NOTE  [name] ZOX:[id number] DOB: [birth year] DOA: 11/28/2011 PCP: No primary provider on file.  Assessment/Plan: Active Problems:  Partial small bowel obstruction  1. Small bowel obstruction discuss with Dr. Donell Beers, surgery will see the patient today, continue NG tube, n.p.o. will try SMOG enema 2.  history of peptic ulcer disease continue PPI 3. Narcotic dependence patient really insistent on narcotics, will continue with IV Dilaudid  and initiate Duragesic patch at 25 mcg per hour  Code Status: full Family Communication: family updated about patient's clinical progress Disposition Plan:  As above    Brief narrative: 56 year old female complaining of abdominal pain that started last night and constipation for 2-1/2 weeks. States that she try to use an enema last night with clear return.She is on chronic narcotics and is on dependent on dulcolax   Consultants:  Surgery      HPI/Subjective: Nausea and vomiting have resolved   Objective: Filed Vitals:   11/28/11 1649 11/28/11 1957 11/28/11 2100 11/29/11 0500  BP: 149/74 152/77 162/87 154/73  Pulse: 89 71 68 75  Temp: 98.8 F (37.1 C)  97.9 F (36.6 C) 97.8 F (36.6 C)  TempSrc: Oral  Oral Oral  Resp: 16 14 17 16   Height:      Weight:      SpO2: 97% 100% 97% 98%    Intake/Output Summary (Last 24 hours) at 11/29/11 1043 Last data filed at 11/29/11 0833  Gross per 24 hour  Intake 766.25 ml  Output    950 ml  Net -183.75 ml    Exam:  HENT:  Head: Atraumatic.  Nose: Nose normal.  Mouth/Throat: Oropharynx is clear and moist.  Eyes: Conjunctivae are normal. Pupils are equal, round, and reactive to light. No scleral icterus.  Neck: Neck supple. No tracheal deviation present.  Cardiovascular: Normal rate, regular rhythm, normal heart sounds and intact distal pulses.  Pulmonary/Chest: Effort normal and breath sounds normal. No respiratory distress.  Abdominal: Soft. Normal  appearance and bowel sounds are normal. She exhibits no distension. There is no tenderness.  Musculoskeletal: She exhibits no edema and no tenderness.  Neurological: She is alert. No cranial nerve deficit.    Data Reviewed: Basic Metabolic Panel:  Lab 11/29/11 8119 11/28/11 1253  NA 140 140  K 3.3* 3.8  CL 105 102  CO2 26 26  GLUCOSE 90 122*  BUN 16 20  CREATININE 0.66 0.73  CALCIUM 8.8 9.7  MG -- --  PHOS -- --    Liver Function Tests:  Lab 11/29/11 0523 11/28/11 1253  AST 10 13  ALT 5 6  ALKPHOS 76 103  BILITOT 0.5 0.4  PROT 6.1 7.3  ALBUMIN 3.2* 4.1    Lab 11/28/11 1253  LIPASE 20  AMYLASE --   No results found for this basename: AMMONIA:5 in the last 168 hours  CBC:  Lab 11/29/11 0523 11/28/11 1253  WBC 8.0 10.9*  NEUTROABS -- 9.1*  HGB 13.9 16.2*  HCT 42.5 47.5*  MCV 94.4 93.0  PLT 198 232    Cardiac Enzymes:  Lab 11/29/11 0523 11/28/11 2140  CKTOTAL 30 26  CKMB 1.5 1.4  CKMBINDEX -- --  TROPONINI <0.30 <0.30   BNP (last 3 results) No results found for this basename: PROBNP:3 in the last 8760 hours   CBG: No results found for this basename: GLUCAP:5 in the last 168 hours  No results found for this or any previous visit (from the past 240 hour(s)).   Studies: Ct  Abdomen Pelvis W Contrast  11/28/2011  *RADIOLOGY REPORT*  Clinical Data: Mid abdominal pain, small bowel obstruction  CT ABDOMEN AND PELVIS WITH CONTRAST  Technique:  Multidetector CT imaging of the abdomen and pelvis was performed following the standard protocol during bolus administration of intravenous contrast. Sagittal and coronal MPR images reconstructed from axial data set.  Contrast: 80mL OMNIPAQUE IOHEXOL 300 MG/ML  SOLN Dilute oral contrast.  Comparison: 02/25/2006  Findings: Lung bases clear. Minimal focal fatty infiltration of liver adjacent the falciform fissure. Minimal central intrahepatic biliary prominence similar to previous exam. Liver, spleen, pancreas, kidneys, and  adrenal glands normal appearance. Unremarkable bladder, ureters, uterus, and adnexae. Appendix not definitely visualized but no pericecal inflammatory process noted.  Proximal small bowel loops are larger than distal small bowel loops with a subtle zone of transition to normal caliber in the central pelvis. Stomach and colon normal appearance. No definite bowel wall thickening, mass, or hernia. Tiny amount of free pelvic fluid, potentially physiologic. Scattered atherosclerotic calcifications aorta and iliac arteries. No acute osseous findings.  IMPRESSION: Mildly dilated proximal and decompressed distal small bowel loops with a subtle zone of transition in the central pelvis suspicious for a degree of small bowel obstruction. This is of uncertain etiology. Nonvisualization of the appendix. No other significant intra abdominal or intrapelvic abnormalities.  Original Report Authenticated By: Lollie Marrow, M.D.   Dg Abd Acute W/chest  11/28/2011  *RADIOLOGY REPORT*  Clinical Data: Vomiting, mid abdominal pain, constipation, smoking history  ACUTE ABDOMEN SERIES (ABDOMEN 2 VIEW & CHEST 1 VIEW)  Comparison: CT abdomen pelvis of 02/25/2006, and chest x-ray of 03/26/2006  Findings: The lungs are clear.  Mediastinal contours appear stable. The heart is within normal limits in size.  No bony abnormality is seen.  Supine and erect views of the abdomen show dilatation of small bowel loops with scattered air-fluid levels, suspicious for partial small bowel obstruction.  Only a small amount of colonic bowel gas is seen with feces throughout the colon.  No free air is noted on the erect view.  No opaque calculi are seen.  IMPRESSION:  1.  Suspect partial small bowel obstruction.  Consider CT of the abdomen and pelvis with IV contrast media.  No free air. 2.  No active lung disease.  Original Report Authenticated By: Juline Patch, M.D.    Scheduled Meds:   . bisacodyl  10 mg Rectal Once  . enoxaparin (LOVENOX)  injection  40 mg Subcutaneous Q24H  . fentaNYL  25 mcg Transdermal Q72H  .  HYDROmorphone (DILAUDID) injection  2 mg Intravenous Once  .  morphine injection  4 mg Intravenous Once  . nicotine  14 mg Transdermal Daily  . ondansetron      . ondansetron  8 mg Oral Once  . promethazine  12.5 mg Intravenous Once  . sorbitol, milk of mag, mineral oil, glycerin (SMOG) enema  960 mL Rectal Once  . DISCONTD: sodium chloride   Intravenous STAT   Continuous Infusions:   . sodium chloride 75 mL/hr at 11/28/11 2056  . pantoprozole (PROTONIX) infusion 8 mg/hr (11/28/11 2258)    Active Problems:  Partial small bowel obstruction    Time spent: 40 minutes   [facility]  [facility] Pager [phone]. If 8PM-8AM, please contact night-coverage at [email or web address], password [place] 11/29/2011, 10:43 AM  LOS: 1 day

## 2011-11-29 NOTE — Consult Note (Signed)
Reason for Consult: pSBO Referring Physician: Susie Cassette, MD  [name] is an 56 y.o. female.  HPI:  Pt is a 56 year old female who presents with projectile bilious emesis and abdominal pain for 2-3 days.  She has chronic low back pain and is on narcotics.  Over the last 2 weeks, she has had significant constipation.  She states that she tried laxatives, but these did not help.   The abdominal pain was severe yesterday, but has improved.  She has never had abdominal surgery. She had difficulty drinking CT contrast.  She has never had colonoscopy.  She has not had symptoms like this before.    Past Medical History  Diagnosis Date  . Hypertension     History reviewed. No pertinent past surgical history.  No family history on file.  Social History:  reports that she has been smoking.  She does not have any smokeless tobacco history on file. She reports that she does not drink alcohol or use illicit drugs.  Allergies: No Known Allergies  Medications: MedicationsLong-Term  Prescriptions Show Facility-Administered Medications    cloNIDine (CATAPRES) 0.2 MG tablet   HYDROcodone-acetaminophen (NORCO) 7.5-325 MG per tablet   QUEtiapine (SEROQUEL) 100 MG tablet     Results for orders placed during the hospital encounter of 11/28/11 (from the past 48 hour(s))  OCCULT BLOOD, POC DEVICE     Status: Normal   Collection Time   11/28/11 12:44 PM      Component Value Range Comment   Fecal Occult Bld POSITIVE     CBC WITH DIFFERENTIAL     Status: Abnormal   Collection Time   11/28/11 12:53 PM      Component Value Range Comment   WBC 10.9 (*) 4.0 - 10.5 K/uL    RBC 5.11  3.87 - 5.11 MIL/uL    Hemoglobin 16.2 (*) 12.0 - 15.0 g/dL    HCT 16.1 (*) 09.6 - 46.0 %    MCV 93.0  78.0 - 100.0 fL    MCH 31.7  26.0 - 34.0 pg    MCHC 34.1  30.0 - 36.0 g/dL    RDW 04.5  40.9 - 81.1 %    Platelets 232  150 - 400 K/uL    Neutrophils Relative 84 (*) 43 - 77 %    Neutro Abs 9.1 (*) 1.7 - 7.7 K/uL    Lymphocytes Relative 8 (*) 12 - 46 %    Lymphs Abs 0.9  0.7 - 4.0 K/uL    Monocytes Relative 6  3 - 12 %    Monocytes Absolute 0.7  0.1 - 1.0 K/uL    Eosinophils Relative 2  0 - 5 %    Eosinophils Absolute 0.2  0.0 - 0.7 K/uL    Basophils Relative 0  0 - 1 %    Basophils Absolute 0.0  0.0 - 0.1 K/uL   COMPREHENSIVE METABOLIC PANEL     Status: Abnormal   Collection Time   11/28/11 12:53 PM      Component Value Range Comment   Sodium 140  135 - 145 mEq/L    Potassium 3.8  3.5 - 5.1 mEq/L    Chloride 102  96 - 112 mEq/L    CO2 26  19 - 32 mEq/L    Glucose, Bld 122 (*) 70 - 99 mg/dL    BUN 20  6 - 23 mg/dL    Creatinine, Ser 9.14  0.50 - 1.10 mg/dL    Calcium 9.7  8.4 - 10.5 mg/dL    Total Protein 7.3  6.0 - 8.3 g/dL    Albumin 4.1  3.5 - 5.2 g/dL    AST 13  0 - 37 U/L    ALT 6  0 - 35 U/L    Alkaline Phosphatase 103  39 - 117 U/L    Total Bilirubin 0.4  0.3 - 1.2 mg/dL    GFR calc non Af Amer >90  >90 mL/min    GFR calc Af Amer >90  >90 mL/min   LIPASE, BLOOD     Status: Normal   Collection Time   11/28/11 12:53 PM      Component Value Range Comment   Lipase 20  11 - 59 U/L   TSH     Status: Abnormal   Collection Time   11/28/11  8:40 PM      Component Value Range Comment   TSH 0.322 (*) 0.350 - 4.500 uIU/mL   CARDIAC PANEL(CRET KIN+CKTOT+MB+TROPI)     Status: Normal   Collection Time   11/28/11  9:40 PM      Component Value Range Comment   Total CK 26  7 - 177 U/L    CK, MB 1.4  0.3 - 4.0 ng/mL    Troponin I <0.30  <0.30 ng/mL    Relative Index RELATIVE INDEX IS INVALID  0.0 - 2.5   CARDIAC PANEL(CRET KIN+CKTOT+MB+TROPI)     Status: Normal   Collection Time   11/29/11  5:23 AM      Component Value Range Comment   Total CK 30  7 - 177 U/L    CK, MB 1.5  0.3 - 4.0 ng/mL    Troponin I <0.30  <0.30 ng/mL    Relative Index RELATIVE INDEX IS INVALID  0.0 - 2.5   COMPREHENSIVE METABOLIC PANEL     Status: Abnormal   Collection Time   11/29/11  5:23 AM      Component Value  Range Comment   Sodium 140  135 - 145 mEq/L    Potassium 3.3 (*) 3.5 - 5.1 mEq/L    Chloride 105  96 - 112 mEq/L    CO2 26  19 - 32 mEq/L    Glucose, Bld 90  70 - 99 mg/dL    BUN 16  6 - 23 mg/dL    Creatinine, Ser 4.09  0.50 - 1.10 mg/dL    Calcium 8.8  8.4 - 81.1 mg/dL    Total Protein 6.1  6.0 - 8.3 g/dL    Albumin 3.2 (*) 3.5 - 5.2 g/dL    AST 10  0 - 37 U/L    ALT 5  0 - 35 U/L    Alkaline Phosphatase 76  39 - 117 U/L    Total Bilirubin 0.5  0.3 - 1.2 mg/dL    GFR calc non Af Amer >90  >90 mL/min    GFR calc Af Amer >90  >90 mL/min   CBC     Status: Normal   Collection Time   11/29/11  5:23 AM      Component Value Range Comment   WBC 8.0  4.0 - 10.5 K/uL    RBC 4.50  3.87 - 5.11 MIL/uL    Hemoglobin 13.9  12.0 - 15.0 g/dL    HCT 91.4  78.2 - 95.6 %    MCV 94.4  78.0 - 100.0 fL    MCH 30.9  26.0 - 34.0 pg    MCHC 32.7  30.0 - 36.0 g/dL    RDW 16.1  09.6 - 04.5 %    Platelets 198  150 - 400 K/uL     Dg Abd 1 View  11/29/2011  *RADIOLOGY REPORT*  Clinical Data: Constipation, possible bowel obstruction  ABDOMEN - 1 VIEW  Comparison: 11/28/2011  Findings: Residual oral contrast has migrated into the colon from yesterday's CT exam.  Scattered air, contrast and stool throughout the bowel.  Negative for obstruction pattern or significant dilatation.  No ileus.  Atherosclerosis of the aorta.  Degenerative changes of the lower lumbar spine.  IMPRESSION: Migration of CT oral contrast into the colon since yesterday. Negative for obstruction.  Original Report Authenticated By: Judie Petit. Ruel Favors, M.D.   Ct Abdomen Pelvis W Contrast  11/28/2011  *RADIOLOGY REPORT*  Clinical Data: Mid abdominal pain, small bowel obstruction  CT ABDOMEN AND PELVIS WITH CONTRAST  Technique:  Multidetector CT imaging of the abdomen and pelvis was performed following the standard protocol during bolus administration of intravenous contrast. Sagittal and coronal MPR images reconstructed from axial data set.   Contrast: 80mL OMNIPAQUE IOHEXOL 300 MG/ML  SOLN Dilute oral contrast.  Comparison: 02/25/2006  Findings: Lung bases clear. Minimal focal fatty infiltration of liver adjacent the falciform fissure. Minimal central intrahepatic biliary prominence similar to previous exam. Liver, spleen, pancreas, kidneys, and adrenal glands normal appearance. Unremarkable bladder, ureters, uterus, and adnexae. Appendix not definitely visualized but no pericecal inflammatory process noted.  Proximal small bowel loops are larger than distal small bowel loops with a subtle zone of transition to normal caliber in the central pelvis. Stomach and colon normal appearance. No definite bowel wall thickening, mass, or hernia. Tiny amount of free pelvic fluid, potentially physiologic. Scattered atherosclerotic calcifications aorta and iliac arteries. No acute osseous findings.  IMPRESSION: Mildly dilated proximal and decompressed distal small bowel loops with a subtle zone of transition in the central pelvis suspicious for a degree of small bowel obstruction. This is of uncertain etiology. Nonvisualization of the appendix. No other significant intra abdominal or intrapelvic abnormalities.  Original Report Authenticated By: Lollie Marrow, M.D.   Dg Abd Acute W/chest  11/28/2011  *RADIOLOGY REPORT*  Clinical Data: Vomiting, mid abdominal pain, constipation, smoking history  ACUTE ABDOMEN SERIES (ABDOMEN 2 VIEW & CHEST 1 VIEW)  Comparison: CT abdomen pelvis of 02/25/2006, and chest x-ray of 03/26/2006  Findings: The lungs are clear.  Mediastinal contours appear stable. The heart is within normal limits in size.  No bony abnormality is seen.  Supine and erect views of the abdomen show dilatation of small bowel loops with scattered air-fluid levels, suspicious for partial small bowel obstruction.  Only a small amount of colonic bowel gas is seen with feces throughout the colon.  No free air is noted on the erect view.  No opaque calculi are seen.   IMPRESSION:  1.  Suspect partial small bowel obstruction.  Consider CT of the abdomen and pelvis with IV contrast media.  No free air. 2.  No active lung disease.  Original Report Authenticated By: Juline Patch, M.D.    Review of Systems  Constitutional: Negative.   Eyes: Negative.   Respiratory: Negative.   Cardiovascular: Negative.   Gastrointestinal: Positive for nausea, vomiting, abdominal pain and constipation (severe constipation the last 2 weeks.).  Genitourinary: Negative.   Musculoskeletal: Positive for back pain.  Skin: Negative.   Neurological: Positive for headaches.  Endo/Heme/Allergies: Negative.   Psychiatric/Behavioral: Negative.    Blood pressure 154/73, pulse 75,  temperature 97.8 F (36.6 C), temperature source Oral, resp. rate 16, height 5\' 5"  (1.651 m), weight 140 lb (63.504 kg), SpO2 98.00%. Physical Exam  Constitutional: She is oriented to person, place, and time. She appears well-developed and well-nourished. She appears distressed (mild).  HENT:  Head: Normocephalic and atraumatic.  Eyes: Conjunctivae are normal. Pupils are equal, round, and reactive to light. No scleral icterus.  Neck: Normal range of motion. Neck supple. No tracheal deviation present. No thyromegaly present.  Cardiovascular: Normal rate, regular rhythm, normal heart sounds and intact distal pulses.  Exam reveals no gallop and no friction rub.   No murmur heard. Respiratory: Breath sounds normal. No respiratory distress. She has no wheezes. She has no rales. She exhibits no tenderness.  GI: Soft. Bowel sounds are normal. She exhibits no distension and no mass. There is no tenderness. There is no rebound and no guarding.  Musculoskeletal: Normal range of motion. She exhibits no edema and no tenderness.  Lymphadenopathy:    She has no cervical adenopathy.  Neurological: She is alert and oriented to person, place, and time. Coordination normal.  Skin: Skin is warm and dry. No rash noted. She is  not diaphoretic. No erythema. No pallor.  Psychiatric: She has a normal mood and affect. Her behavior is normal. Judgment and thought content normal.    Assessment/Plan: Nausea/vomiting/abdominal pain  Think SBO is unlikely given lack of prior surgery and flatus today. Also, on plain films today, contrast flows through to colon. Suspect she is obstipated. Recommend SMOG enemas Once she is having BMs and tolerating diet, would get on aggressive bowel regimen to avoid significant constipation on chronic narcotics.    Beverley Sherrard 11/29/2011, 1:23 PM

## 2011-11-29 NOTE — Plan of Care (Signed)
Problem: Consults Goal: General Medical Patient Education See Patient Education Module for specific education. Outcome: Completed/Met Date Met:  11/29/11 Small Bowel Obstruction

## 2011-11-29 NOTE — Telephone Encounter (Signed)
This encounter was created in error - please disregard.  This encounter was created in error - please disregard.

## 2011-11-29 NOTE — ED Provider Notes (Signed)
Medical screening examination/treatment/procedure(s) were performed by non-physician practitioner and as supervising physician I was immediately available for consultation/collaboration.   Dajsha Massaro R Rutha Melgoza, MD 11/29/11 1215 

## 2011-11-30 ENCOUNTER — Encounter (HOSPITAL_COMMUNITY): Payer: Self-pay | Admitting: Surgery

## 2011-11-30 DIAGNOSIS — K5909 Other constipation: Secondary | ICD-10-CM | POA: Diagnosis present

## 2011-11-30 DIAGNOSIS — K5900 Constipation, unspecified: Secondary | ICD-10-CM

## 2011-11-30 MED ORDER — BISACODYL 10 MG RE SUPP: 10.0000 mg | Freq: Two times a day (BID) | RECTAL | Status: DC | PRN

## 2011-11-30 MED ORDER — PHENOL 1.4 % MT LIQD
2.0000 | OROMUCOSAL | Status: DC | PRN
  Filled 2011-11-30: qty 177

## 2011-11-30 MED ORDER — MENTHOL 3 MG MT LOZG
1.0000 | LOZENGE | OROMUCOSAL | Status: DC | PRN
  Filled 2011-11-30: qty 9

## 2011-11-30 MED ORDER — MAGIC MOUTHWASH
15.0000 mL | Freq: Four times a day (QID) | ORAL | Status: DC | PRN
  Administered 2011-11-30: 15 mL via ORAL
  Filled 2011-11-30: qty 15

## 2011-11-30 NOTE — Progress Notes (Signed)
[name] [id number] 1956-04-01  CARE TEAM:  PCP: No primary provider on file.  Outpatient Care Team: Patient has no care team.  Inpatient Treatment Team: Treatment Team: Attending Provider: Richarda Overlie, MD; Rounding Team: Sibyl Parr, MD; Rounding Team: Jessie Foot, MD; Technician: Heloise Ochoa, EMT; Registered Nurse: Lyla Son, RN; Registered Nurse: Sherron Monday, RN; Rounding Team: Md Montez Morita, MD; Technician: Melvenia Beam, NT; Technician: Joellyn Haff, NT  Subjective:  BM x3 yesterday after enema Some HB No nausea/vomiting +flatus   Objective:  Vital signs:  Filed Vitals:   11/29/11 0500 11/29/11 1400 11/29/11 2045 11/30/11 0500  BP: 154/73 153/73 155/85 163/73  Pulse: 75 74 68 77  Temp: 97.8 F (36.6 C) 97.9 F (36.6 C) 98.5 F (36.9 C) 98 F (36.7 C)  TempSrc: Oral Oral Oral Oral  Resp: 16 18 18 18   Height:      Weight:      SpO2: 98% 95% 97% 98%    Last BM Date: 11/29/11  Intake/Output   Yesterday:  07/20 0701 - 07/21 0700 In: 2324.6 [I.V.:2324.6] Out: 600 [Urine:200; Emesis/NG output:400] This shift:     Bowel function:  Flatus: y  BM: y  Physical Exam:  General: Pt awake/alert/oriented x4 in no acute distress Eyes: PERRL, normal EOM.  Sclera clear.  No icterus Neuro: CN II-XII intact w/o focal sensory/motor deficits. Lymph: No head/neck/groin lymphadenopathy Psych:  No delerium/psychosis/paranoia HENT: Normocephalic, Mucus membranes moist.  No thrush.  NGT in place Neck: Supple, No tracheal deviation Chest: No chest wall pain w good excursion CV:  Pulses intact.  Regular rhythm Abdomen: Soft.  Nondistended.  Nontender.  No incarcerated hernias. Ext:  SCDs BLE.  No mjr edema.  No cyanosis Skin: No petechiae / purpurae  Problem List:  Active Problems:  Partial small bowel obstruction  Constipation, chronic   Assessment  [name]  56 y.o. female       Opening up  Plan:  -Clamp NGT -clear  liquids -prob d/c NGT later today if continues to improve -VTE prophylaxis- SCDs, etc -mobilize as tolerated to help recovery -pt will need screening colonoscopy with age>50 & constipation.  She is motivated to set up after d/c  The patient is stable.  There is no evidence of peritonitis, acute abdomen, nor shock.  There is no strong evidence of failure of improvement nor decline with current non-operative management.  There is no need for surgery at the present moment.   Ardeth Sportsman, M.D., F.A.C.S. Gastrointestinal and Minimally Invasive Surgery [facility] [address] [place], [place] [address] [phone] Main / Paging [phone] Voice Mail   11/30/2011  Results:   Labs: Results for orders placed during the hospital encounter of 11/28/11 (from the past 48 hour(s))  OCCULT BLOOD, POC DEVICE     Status: Normal   Collection Time   11/28/11 12:44 PM      Component Value Range Comment   Fecal Occult Bld POSITIVE     CBC WITH DIFFERENTIAL     Status: Abnormal   Collection Time   11/28/11 12:53 PM      Component Value Range Comment   WBC 10.9 (*) 4.0 - 10.5 K/uL    RBC 5.11  3.87 - 5.11 MIL/uL    Hemoglobin 16.2 (*) 12.0 - 15.0 g/dL    HCT 62.2 (*) 29.7 - 46.0 %    MCV 93.0  78.0 - 100.0 fL  MCH 31.7  26.0 - 34.0 pg    MCHC 34.1  30.0 - 36.0 g/dL    RDW 16.1  09.6 - 04.5 %    Platelets 232  150 - 400 K/uL    Neutrophils Relative 84 (*) 43 - 77 %    Neutro Abs 9.1 (*) 1.7 - 7.7 K/uL    Lymphocytes Relative 8 (*) 12 - 46 %    Lymphs Abs 0.9  0.7 - 4.0 K/uL    Monocytes Relative 6  3 - 12 %    Monocytes Absolute 0.7  0.1 - 1.0 K/uL    Eosinophils Relative 2  0 - 5 %    Eosinophils Absolute 0.2  0.0 - 0.7 K/uL    Basophils Relative 0  0 - 1 %    Basophils Absolute 0.0  0.0 - 0.1 K/uL   COMPREHENSIVE METABOLIC PANEL     Status: Abnormal   Collection Time   11/28/11 12:53 PM      Component Value Range Comment   Sodium 140  135  - 145 mEq/L    Potassium 3.8  3.5 - 5.1 mEq/L    Chloride 102  96 - 112 mEq/L    CO2 26  19 - 32 mEq/L    Glucose, Bld 122 (*) 70 - 99 mg/dL    BUN 20  6 - 23 mg/dL    Creatinine, Ser 4.09  0.50 - 1.10 mg/dL    Calcium 9.7  8.4 - 81.1 mg/dL    Total Protein 7.3  6.0 - 8.3 g/dL    Albumin 4.1  3.5 - 5.2 g/dL    AST 13  0 - 37 U/L    ALT 6  0 - 35 U/L    Alkaline Phosphatase 103  39 - 117 U/L    Total Bilirubin 0.4  0.3 - 1.2 mg/dL    GFR calc non Af Amer >90  >90 mL/min    GFR calc Af Amer >90  >90 mL/min   LIPASE, BLOOD     Status: Normal   Collection Time   11/28/11 12:53 PM      Component Value Range Comment   Lipase 20  11 - 59 U/L   TSH     Status: Abnormal   Collection Time   11/28/11  8:40 PM      Component Value Range Comment   TSH 0.322 (*) 0.350 - 4.500 uIU/mL   CARDIAC PANEL(CRET KIN+CKTOT+MB+TROPI)     Status: Normal   Collection Time   11/28/11  9:40 PM      Component Value Range Comment   Total CK 26  7 - 177 U/L    CK, MB 1.4  0.3 - 4.0 ng/mL    Troponin I <0.30  <0.30 ng/mL    Relative Index RELATIVE INDEX IS INVALID  0.0 - 2.5   CARDIAC PANEL(CRET KIN+CKTOT+MB+TROPI)     Status: Normal   Collection Time   11/29/11  5:23 AM      Component Value Range Comment   Total CK 30  7 - 177 U/L    CK, MB 1.5  0.3 - 4.0 ng/mL    Troponin I <0.30  <0.30 ng/mL    Relative Index RELATIVE INDEX IS INVALID  0.0 - 2.5   COMPREHENSIVE METABOLIC PANEL     Status: Abnormal   Collection Time   11/29/11  5:23 AM      Component Value Range Comment   Sodium 140  135 -  145 mEq/L    Potassium 3.3 (*) 3.5 - 5.1 mEq/L    Chloride 105  96 - 112 mEq/L    CO2 26  19 - 32 mEq/L    Glucose, Bld 90  70 - 99 mg/dL    BUN 16  6 - 23 mg/dL    Creatinine, Ser 0.45  0.50 - 1.10 mg/dL    Calcium 8.8  8.4 - 40.9 mg/dL    Total Protein 6.1  6.0 - 8.3 g/dL    Albumin 3.2 (*) 3.5 - 5.2 g/dL    AST 10  0 - 37 U/L    ALT 5  0 - 35 U/L    Alkaline Phosphatase 76  39 - 117 U/L    Total Bilirubin  0.5  0.3 - 1.2 mg/dL    GFR calc non Af Amer >90  >90 mL/min    GFR calc Af Amer >90  >90 mL/min   CBC     Status: Normal   Collection Time   11/29/11  5:23 AM      Component Value Range Comment   WBC 8.0  4.0 - 10.5 K/uL    RBC 4.50  3.87 - 5.11 MIL/uL    Hemoglobin 13.9  12.0 - 15.0 g/dL    HCT 81.1  91.4 - 78.2 %    MCV 94.4  78.0 - 100.0 fL    MCH 30.9  26.0 - 34.0 pg    MCHC 32.7  30.0 - 36.0 g/dL    RDW 95.6  21.3 - 08.6 %    Platelets 198  150 - 400 K/uL   CARDIAC PANEL(CRET KIN+CKTOT+MB+TROPI)     Status: Normal   Collection Time   11/29/11 12:40 PM      Component Value Range Comment   Total CK 42  7 - 177 U/L    CK, MB 1.7  0.3 - 4.0 ng/mL    Troponin I <0.30  <0.30 ng/mL    Relative Index RELATIVE INDEX IS INVALID  0.0 - 2.5     Imaging / Studies: Dg Abd 1 View  11/29/2011  *RADIOLOGY REPORT*  Clinical Data: Constipation, possible bowel obstruction  ABDOMEN - 1 VIEW  Comparison: 11/28/2011  Findings: Residual oral contrast has migrated into the colon from yesterday's CT exam.  Scattered air, contrast and stool throughout the bowel.  Negative for obstruction pattern or significant dilatation.  No ileus.  Atherosclerosis of the aorta.  Degenerative changes of the lower lumbar spine.  IMPRESSION: Migration of CT oral contrast into the colon since yesterday. Negative for obstruction.  Original Report Authenticated By: Judie Petit. Ruel Favors, M.D.   Ct Abdomen Pelvis W Contrast  11/28/2011  *RADIOLOGY REPORT*  Clinical Data: Mid abdominal pain, small bowel obstruction  CT ABDOMEN AND PELVIS WITH CONTRAST  Technique:  Multidetector CT imaging of the abdomen and pelvis was performed following the standard protocol during bolus administration of intravenous contrast. Sagittal and coronal MPR images reconstructed from axial data set.  Contrast: 80mL OMNIPAQUE IOHEXOL 300 MG/ML  SOLN Dilute oral contrast.  Comparison: 02/25/2006  Findings: Lung bases clear. Minimal focal fatty infiltration of  liver adjacent the falciform fissure. Minimal central intrahepatic biliary prominence similar to previous exam. Liver, spleen, pancreas, kidneys, and adrenal glands normal appearance. Unremarkable bladder, ureters, uterus, and adnexae. Appendix not definitely visualized but no pericecal inflammatory process noted.  Proximal small bowel loops are larger than distal small bowel loops with a subtle zone of transition to normal caliber in  the central pelvis. Stomach and colon normal appearance. No definite bowel wall thickening, mass, or hernia. Tiny amount of free pelvic fluid, potentially physiologic. Scattered atherosclerotic calcifications aorta and iliac arteries. No acute osseous findings.  IMPRESSION: Mildly dilated proximal and decompressed distal small bowel loops with a subtle zone of transition in the central pelvis suspicious for a degree of small bowel obstruction. This is of uncertain etiology. Nonvisualization of the appendix. No other significant intra abdominal or intrapelvic abnormalities.  Original Report Authenticated By: Lollie Marrow, M.D.   Dg Abd Acute W/chest  11/28/2011  *RADIOLOGY REPORT*  Clinical Data: Vomiting, mid abdominal pain, constipation, smoking history  ACUTE ABDOMEN SERIES (ABDOMEN 2 VIEW & CHEST 1 VIEW)  Comparison: CT abdomen pelvis of 02/25/2006, and chest x-ray of 03/26/2006  Findings: The lungs are clear.  Mediastinal contours appear stable. The heart is within normal limits in size.  No bony abnormality is seen.  Supine and erect views of the abdomen show dilatation of small bowel loops with scattered air-fluid levels, suspicious for partial small bowel obstruction.  Only a small amount of colonic bowel gas is seen with feces throughout the colon.  No free air is noted on the erect view.  No opaque calculi are seen.  IMPRESSION:  1.  Suspect partial small bowel obstruction.  Consider CT of the abdomen and pelvis with IV contrast media.  No free air. 2.  No active lung  disease.  Original Report Authenticated By: Juline Patch, M.D.    Medications / Allergies: per chart  Antibiotics: Anti-infectives    None

## 2011-11-30 NOTE — Progress Notes (Signed)
[facility] PROGRESS NOTE  [name] ZOX:[id number] DOB: [birth year] DOA: 11/28/2011 PCP: No primary provider on file.  Assessment/Plan: Active Problems:  Partial small bowel obstruction  Constipation, chronic Partial small bowel obstruction   Small bowel obstruction discuss with Dr. Donell Beers, surgery will see the patient today, continue NG tube, n.p.o. will try SMOG enema, -Clamp NGT  -clear liquids  -prob d/c NGT later today if continues to improve  -VTE prophylaxis- SCDs, etc  -mobilize as tolerated to help recovery  -pt will need screening colonoscopy with age>50 & constipation 1. history of peptic ulcer disease continue PPI 2. Narcotic dependence patient really insistent on narcotics, will continue with IV Dilaudid and initiate Duragesic patch at 25 mcg per hour   1.   Code Status: full Family Communication: family updated about patient's clinical progress Disposition Plan:  As above         HPI/Subjective: BM x3 yesterday after enema  Some HB  No nausea/vomiting  +flatus   Objective: Filed Vitals:   11/29/11 0500 11/29/11 1400 11/29/11 2045 11/30/11 0500  BP: 154/73 153/73 155/85 163/73  Pulse: 75 74 68 77  Temp: 97.8 F (36.6 C) 97.9 F (36.6 C) 98.5 F (36.9 C) 98 F (36.7 C)  TempSrc: Oral Oral Oral Oral  Resp: 16 18 18 18   Height:      Weight:      SpO2: 98% 95% 97% 98%    Intake/Output Summary (Last 24 hours) at 11/30/11 1148 Last data filed at 11/30/11 0810  Gross per 24 hour  Intake 1698.75 ml  Output    825 ml  Net 873.75 ml    Exam:  HENT:  Head: Atraumatic.  Nose: Nose normal.  Mouth/Throat: Oropharynx is clear and moist.  Eyes: Conjunctivae are normal. Pupils are equal, round, and reactive to light. No scleral icterus.  Neck: Neck supple. No tracheal deviation present.  Cardiovascular: Normal rate, regular rhythm, normal heart sounds and intact distal pulses.  Pulmonary/Chest: Effort normal and breath sounds normal.  No respiratory distress.  Abdominal: Soft. Normal appearance and bowel sounds are normal. She exhibits no distension. There is no tenderness.  Musculoskeletal: She exhibits no edema and no tenderness.  Neurological: She is alert. No cranial nerve deficit.    Data Reviewed: Basic Metabolic Panel:  Lab 11/29/11 8119 11/28/11 1253  NA 140 140  K 3.3* 3.8  CL 105 102  CO2 26 26  GLUCOSE 90 122*  BUN 16 20  CREATININE 0.66 0.73  CALCIUM 8.8 9.7  MG -- --  PHOS -- --    Liver Function Tests:  Lab 11/29/11 0523 11/28/11 1253  AST 10 13  ALT 5 6  ALKPHOS 76 103  BILITOT 0.5 0.4  PROT 6.1 7.3  ALBUMIN 3.2* 4.1    Lab 11/28/11 1253  LIPASE 20  AMYLASE --   No results found for this basename: AMMONIA:5 in the last 168 hours  CBC:  Lab 11/29/11 0523 11/28/11 1253  WBC 8.0 10.9*  NEUTROABS -- 9.1*  HGB 13.9 16.2*  HCT 42.5 47.5*  MCV 94.4 93.0  PLT 198 232    Cardiac Enzymes:  Lab 11/29/11 1240 11/29/11 0523 11/28/11 2140  CKTOTAL 42 30 26  CKMB 1.7 1.5 1.4  CKMBINDEX -- -- --  TROPONINI <0.30 <0.30 <0.30   BNP (last 3 results) No results found for this basename: PROBNP:3 in the last 8760 hours   CBG: No results found for this basename: GLUCAP:5 in the last 168 hours  No results found for this or any previous visit (from the past 240 hour(s)).   Studies: Dg Abd 1 View  11/29/2011  *RADIOLOGY REPORT*  Clinical Data: Constipation, possible bowel obstruction  ABDOMEN - 1 VIEW  Comparison: 11/28/2011  Findings: Residual oral contrast has migrated into the colon from yesterday's CT exam.  Scattered air, contrast and stool throughout the bowel.  Negative for obstruction pattern or significant dilatation.  No ileus.  Atherosclerosis of the aorta.  Degenerative changes of the lower lumbar spine.  IMPRESSION: Migration of CT oral contrast into the colon since yesterday. Negative for obstruction.  Original Report Authenticated By: Judie Petit. Ruel Favors, M.D.   Ct Abdomen  Pelvis W Contrast  11/28/2011  *RADIOLOGY REPORT*  Clinical Data: Mid abdominal pain, small bowel obstruction  CT ABDOMEN AND PELVIS WITH CONTRAST  Technique:  Multidetector CT imaging of the abdomen and pelvis was performed following the standard protocol during bolus administration of intravenous contrast. Sagittal and coronal MPR images reconstructed from axial data set.  Contrast: 80mL OMNIPAQUE IOHEXOL 300 MG/ML  SOLN Dilute oral contrast.  Comparison: 02/25/2006  Findings: Lung bases clear. Minimal focal fatty infiltration of liver adjacent the falciform fissure. Minimal central intrahepatic biliary prominence similar to previous exam. Liver, spleen, pancreas, kidneys, and adrenal glands normal appearance. Unremarkable bladder, ureters, uterus, and adnexae. Appendix not definitely visualized but no pericecal inflammatory process noted.  Proximal small bowel loops are larger than distal small bowel loops with a subtle zone of transition to normal caliber in the central pelvis. Stomach and colon normal appearance. No definite bowel wall thickening, mass, or hernia. Tiny amount of free pelvic fluid, potentially physiologic. Scattered atherosclerotic calcifications aorta and iliac arteries. No acute osseous findings.  IMPRESSION: Mildly dilated proximal and decompressed distal small bowel loops with a subtle zone of transition in the central pelvis suspicious for a degree of small bowel obstruction. This is of uncertain etiology. Nonvisualization of the appendix. No other significant intra abdominal or intrapelvic abnormalities.  Original Report Authenticated By: Lollie Marrow, M.D.   Dg Abd Acute W/chest  11/28/2011  *RADIOLOGY REPORT*  Clinical Data: Vomiting, mid abdominal pain, constipation, smoking history  ACUTE ABDOMEN SERIES (ABDOMEN 2 VIEW & CHEST 1 VIEW)  Comparison: CT abdomen pelvis of 02/25/2006, and chest x-ray of 03/26/2006  Findings: The lungs are clear.  Mediastinal contours appear stable. The  heart is within normal limits in size.  No bony abnormality is seen.  Supine and erect views of the abdomen show dilatation of small bowel loops with scattered air-fluid levels, suspicious for partial small bowel obstruction.  Only a small amount of colonic bowel gas is seen with feces throughout the colon.  No free air is noted on the erect view.  No opaque calculi are seen.  IMPRESSION:  1.  Suspect partial small bowel obstruction.  Consider CT of the abdomen and pelvis with IV contrast media.  No free air. 2.  No active lung disease.  Original Report Authenticated By: Juline Patch, M.D.    Scheduled Meds:   . enoxaparin (LOVENOX) injection  40 mg Subcutaneous Q24H  . fentaNYL  25 mcg Transdermal Q72H  . nicotine  14 mg Transdermal Daily  . QUEtiapine  200 mg Oral QHS  . sorbitol, milk of mag, mineral oil, glycerin (SMOG) enema  960 mL Rectal Once  . DISCONTD: bisacodyl  10 mg Rectal Once   Continuous Infusions:   . sodium chloride 75 mL/hr at 11/30/11 0022  . pantoprozole (PROTONIX)  infusion 8 mg/hr (11/30/11 4098)    Active Problems:  Partial small bowel obstruction  Constipation, chronic    Time spent: 40 minutes   [facility]  [facility] Pager [phone]. If 8PM-8AM, please contact night-coverage at [email or web address], password [facility] 11/30/2011, 11:48 AM  LOS: 2 days

## 2011-11-30 NOTE — Progress Notes (Signed)
NG tube was removed per MD order.  After 6 hours of NG tube being clamped, 5 ccs of residual was present.  Pt has been tolerating clear liquids and denies feeling any pain or nausea.

## 2011-12-01 MED ORDER — LACTULOSE SOLN: 30.0000 mL | Freq: Two times a day (BID) | Status: AC | PRN

## 2011-12-01 MED ORDER — PANTOPRAZOLE SODIUM 40 MG PO TBEC: 40.0000 mg | DELAYED_RELEASE_TABLET | Freq: Every day | ORAL | Status: AC

## 2011-12-01 MED FILL — Quetiapine Fumarate Tab 200 MG: ORAL | Qty: 1 | Status: AC

## 2011-12-01 MED FILL — Enoxaparin Sodium Inj 40 MG/0.4ML: SUBCUTANEOUS | Qty: 0.4 | Status: AC

## 2011-12-01 MED FILL — Pantoprazole Sodium For IV Soln 40 MG (Base Equiv): INTRAVENOUS | Qty: 80 | Status: AC

## 2011-12-01 MED FILL — Hydromorphone HCl Preservative Free (PF) Inj 2 MG/ML: INTRAMUSCULAR | Qty: 1 | Status: AC

## 2011-12-01 NOTE — Progress Notes (Signed)
  Subjective: Feeling better and going home today,  Objective: Vital signs in last 24 hours: Temp:  [97.8 F (36.6 C)-98 F (36.7 C)] 98 F (36.7 C) (07/22 0528) Pulse Rate:  [66-84] 66  (07/22 0528) Resp:  [18] 18  (07/22 0528) BP: (144-155)/(75-89) 155/75 mmHg (07/22 0528) SpO2:  [95 %-98 %] 95 % (07/22 0528) Last BM Date: 11/30/11  3 BM's yesterday,  Diet: Dysphagia 3, VSS, no labs, no new films Intake/Output from previous day: 07/21 0701 - 07/22 0700 In: 1845 [P.O.:120; I.V.:1725] Out: 830 [Urine:825; Emesis/NG output:5] Intake/Output this shift: Total I/O In: 1951.3 [P.O.:460; I.V.:1491.3] Out: 1600 [Urine:1600]  General appearance: alert, cooperative and no distress GI: soft, non-tender; bowel sounds normal; no masses,  no organomegaly  Lab Results:   Basename 11/29/11 0523 11/28/11 1253  WBC 8.0 10.9*  HGB 13.9 16.2*  HCT 42.5 47.5*  PLT 198 232    BMET  Basename 11/29/11 0523 11/28/11 1253  NA 140 140  K 3.3* 3.8  CL 105 102  CO2 26 26  GLUCOSE 90 122*  BUN 16 20  CREATININE 0.66 0.73  CALCIUM 8.8 9.7   PT/INR No results found for this basename: LABPROT:2,INR:2 in the last 72 hours   Lab 11/29/11 0523 11/28/11 1253  AST 10 13  ALT 5 6  ALKPHOS 76 103  BILITOT 0.5 0.4  PROT 6.1 7.3  ALBUMIN 3.2* 4.1     Lipase     Component Value Date/Time   LIPASE 20 11/28/2011 1253     Studies/Results: No results found.  Medications:    . enoxaparin (LOVENOX) injection  40 mg Subcutaneous Q24H  . fentaNYL  25 mcg Transdermal Q72H  . nicotine  14 mg Transdermal Daily  . QUEtiapine  200 mg Oral QHS    Assessment/Plan SBO vs constipation   Problem resolved.  Agree with follow up and colonoscopy recommendations.    LOS: 3 days    [name] 12/01/2011

## 2011-12-01 NOTE — Progress Notes (Signed)
Physician Discharge Summary  [name]  MRN: [id number]  DOB/AGE: 12/15/1955 56 y.o.  PCP: No primary provider on file.  Admit date: 11/28/2011  Discharge date: 12/01/2011  Discharge Diagnoses:  Partial small bowel obstruction  Constipation, chronic  Chronic narcotic dependence    Medication List  As of 12/01/2011 9:46 AM     TAKE these medications            cloNIDine 0.2 MG tablet       Commonly known as: CATAPRES       Take 0.2 mg by mouth 2 (two) times daily.       HYDROcodone-acetaminophen 7.5-325 MG per tablet       Commonly known as: NORCO       Take 1 tablet by mouth every 6 (six) hours as needed.       Lactulose Soln       30 mLs by Does not apply route 2 (two) times daily as needed.       pantoprazole 40 MG tablet       Commonly known as: PROTONIX       Take 1 tablet (40 mg total) by mouth daily.       QUEtiapine 100 MG tablet       Commonly known as: SEROQUEL       Take 100 mg by mouth at bedtime as needed.            Discharge Condition: sTable  Disposition:  Consults:  #1 Gen. surgery  Significant Diagnostic Studies:  Dg Abd 1 View  11/29/2011 *RADIOLOGY REPORT* Clinical Data: Constipation, possible bowel obstruction ABDOMEN - 1 VIEW Comparison: 11/28/2011 Findings: Residual oral contrast has migrated into the colon from yesterday's CT exam. Scattered air, contrast and stool throughout the bowel. Negative for obstruction pattern or significant dilatation. No ileus. Atherosclerosis of the aorta. Degenerative changes of the lower lumbar spine. IMPRESSION: Migration of CT oral contrast into the colon since yesterday. Negative for obstruction. Original Report Authenticated By: M. TREVOR SHICK, M.D.  Ct Abdomen Pelvis W Contrast  11/28/2011 *RADIOLOGY REPORT* Clinical Data: Mid abdominal pain, small bowel obstruction CT ABDOMEN AND PELVIS WITH CONTRAST Technique: Multidetector CT imaging of the abdomen and pelvis was performed following the standard protocol during  bolus administration of intravenous contrast. Sagittal and coronal MPR images reconstructed from axial data set. Contrast: 80mL OMNIPAQUE IOHEXOL 300 MG/ML SOLN Dilute oral contrast. Comparison: 02/25/2006 Findings: Lung bases clear. Minimal focal fatty infiltration of liver adjacent the falciform fissure. Minimal central intrahepatic biliary prominence similar to previous exam. Liver, spleen, pancreas, kidneys, and adrenal glands normal appearance. Unremarkable bladder, ureters, uterus, and adnexae. Appendix not definitely visualized but no pericecal inflammatory process noted. Proximal small bowel loops are larger than distal small bowel loops with a subtle zone of transition to normal caliber in the central pelvis. Stomach and colon normal appearance. No definite bowel wall thickening, mass, or hernia. Tiny amount of free pelvic fluid, potentially physiologic. Scattered atherosclerotic calcifications aorta and iliac arteries. No acute osseous findings. IMPRESSION: Mildly dilated proximal and decompressed distal small bowel loops with a subtle zone of transition in the central pelvis suspicious for a degree of small bowel obstruction. This is of uncertain etiology. Nonvisualization of the appendix. No other significant intra abdominal or intrapelvic abnormalities. Original Report Authenticated By: MARK A. BOLES, M.D.  Dg Abd Acute W/chest  11/28/2011 *RADIOLOGY REPORT* Clinical Data: Vomiting, mid abdominal pain, constipation, smoking history ACUTE ABDOMEN SERIES (ABDOMEN 2 VIEW & CHEST 1   VIEW) Comparison: CT abdomen pelvis of 02/25/2006, and chest x-ray of 03/26/2006 Findings: The lungs are clear. Mediastinal contours appear stable. The heart is within normal limits in size. No bony abnormality is seen. Supine and erect views of the abdomen show dilatation of small bowel loops with scattered air-fluid levels, suspicious for partial small bowel obstruction. Only a small amount of colonic bowel gas is seen with  feces throughout the colon. No free air is noted on the erect view. No opaque calculi are seen. IMPRESSION: 1. Suspect partial small bowel obstruction. Consider CT of the abdomen and pelvis with IV contrast media. No free air. 2. No active lung disease. Original Report Authenticated By: PAUL D. BARRY, M.D.  Microbiology:  No results found for this or any previous visit (from the past 240 hour(s)).  Labs:    Results for orders placed during the hospital encounter of 11/28/11 (from the past 48 hour(s))    CARDIAC PANEL(CRET KIN+CKTOT+MB+TROPI) Status: Normal     Collection Time     11/29/11 12:40 PM    Component  Value  Range  Comment     Total CK  42  7 - 177 U/L      CK, MB  1.7  0.3 - 4.0 ng/mL      Troponin I  <0.30  <0.30 ng/mL      Relative Index  RELATIVE INDEX IS INVALID  0.0 - 2.5      HPI 56:year old female who presents with projectile bilious emesis and abdominal pain for 2-3 days. She has chronic low back pain and is on narcotics. Over the last 2 weeks, she has had significant constipation. She states that she tried laxatives, but these did not help. The abdominal pain was severe yesterday, but has improved. She has never had abdominal surgery. She had difficulty drinking CT contrast. She has never had colonoscopy. She has not had symptoms like this before.  HOSPITAL COURSE:  #1 partial small bowel obstruction, surgery consulted. Patient treated with SMOG enema and Dulcolax suppositories with good response, when narcotics were minimized. She had a nasogastric tube placed for a couple of days. Nasogastric tube was clamped yesterday for 5 hours and then removed. Diet was advanced from clear liquids to mechanical soft diet which she tolerated well.  Surgery recommends screening colonoscopy in the outpatient setting  Discharge Exam:  Blood pressure 155/75, pulse 66, temperature 98 F (36.7 C), temperature source Oral, resp. rate 18, height 5' 5" (1.651 m), weight 63.504 kg (140 lb), SpO2  95.00%.  General: Pt awake/alert/oriented x4 in no acute distress  Eyes: PERRL, normal EOM. Sclera clear. No icterus  Neuro: CN II-XII intact w/o focal sensory/motor deficits.  Lymph: No head/neck/groin lymphadenopathy  Psych: No delerium/psychosis/paranoia  HENT: Normocephalic, Mucus membranes moist. No thrush. NGT in place  Neck: Supple, No tracheal deviation  Chest: No chest wall pain w good excursion  CV: Pulses intact. Regular rhythm  Abdomen: Soft. Nondistended. Nontender. No incarcerated hernias.  Ext: SCDs BLE. No mjr edema. No cyanosis  Skin: No petechiae / purpurae    Signed:  Bevelyn Arriola  12/01/2011, 9:46 AM        

## 2011-12-01 NOTE — Progress Notes (Signed)
I saw the patient, participated in the history, exam and medical decision making, and concur with the physician assistant's note above.  Alyx Gee M. Paulette Lynch, MD, FACS General, Bariatric, & Minimally Invasive Surgery [facility]   

## 2011-12-01 NOTE — Discharge Summary (Signed)
Physician Discharge Summary  [name]  MRN: [id number]  DOB/AGE: Feb 05, 1956 56 y.o.  PCP: No primary provider on file.  Admit date: 11/28/2011  Discharge date: 12/01/2011  Discharge Diagnoses:  Partial small bowel obstruction  Constipation, chronic  Chronic narcotic dependence    Medication List  As of 12/01/2011 9:46 AM     TAKE these medications            cloNIDine 0.2 MG tablet       Commonly known as: CATAPRES       Take 0.2 mg by mouth 2 (two) times daily.       HYDROcodone-acetaminophen 7.5-325 MG per tablet       Commonly known as: NORCO       Take 1 tablet by mouth every 6 (six) hours as needed.       Lactulose Soln       30 mLs by Does not apply route 2 (two) times daily as needed.       pantoprazole 40 MG tablet       Commonly known as: PROTONIX       Take 1 tablet (40 mg total) by mouth daily.       QUEtiapine 100 MG tablet       Commonly known as: SEROQUEL       Take 100 mg by mouth at bedtime as needed.            Discharge Condition: sTable  Disposition:  Consults:  #1 Gen. surgery  Significant Diagnostic Studies:  Dg Abd 1 View  11/29/2011 *RADIOLOGY REPORT* Clinical Data: Constipation, possible bowel obstruction ABDOMEN - 1 VIEW Comparison: 11/28/2011 Findings: Residual oral contrast has migrated into the colon from yesterday's CT exam. Scattered air, contrast and stool throughout the bowel. Negative for obstruction pattern or significant dilatation. No ileus. Atherosclerosis of the aorta. Degenerative changes of the lower lumbar spine. IMPRESSION: Migration of CT oral contrast into the colon since yesterday. Negative for obstruction. Original Report Authenticated By: Judie Petit. Ruel Favors, M.D.  Ct Abdomen Pelvis W Contrast  11/28/2011 *RADIOLOGY REPORT* Clinical Data: Mid abdominal pain, small bowel obstruction CT ABDOMEN AND PELVIS WITH CONTRAST Technique: Multidetector CT imaging of the abdomen and pelvis was performed following the standard protocol during  bolus administration of intravenous contrast. Sagittal and coronal MPR images reconstructed from axial data set. Contrast: 80mL OMNIPAQUE IOHEXOL 300 MG/ML SOLN Dilute oral contrast. Comparison: 02/25/2006 Findings: Lung bases clear. Minimal focal fatty infiltration of liver adjacent the falciform fissure. Minimal central intrahepatic biliary prominence similar to previous exam. Liver, spleen, pancreas, kidneys, and adrenal glands normal appearance. Unremarkable bladder, ureters, uterus, and adnexae. Appendix not definitely visualized but no pericecal inflammatory process noted. Proximal small bowel loops are larger than distal small bowel loops with a subtle zone of transition to normal caliber in the central pelvis. Stomach and colon normal appearance. No definite bowel wall thickening, mass, or hernia. Tiny amount of free pelvic fluid, potentially physiologic. Scattered atherosclerotic calcifications aorta and iliac arteries. No acute osseous findings. IMPRESSION: Mildly dilated proximal and decompressed distal small bowel loops with a subtle zone of transition in the central pelvis suspicious for a degree of small bowel obstruction. This is of uncertain etiology. Nonvisualization of the appendix. No other significant intra abdominal or intrapelvic abnormalities. Original Report Authenticated By: Lollie Marrow, M.D.  Dg Abd Acute W/chest  11/28/2011 *RADIOLOGY REPORT* Clinical Data: Vomiting, mid abdominal pain, constipation, smoking history ACUTE ABDOMEN SERIES (ABDOMEN 2 VIEW & CHEST 1  VIEW) Comparison: CT abdomen pelvis of 02/25/2006, and chest x-ray of 03/26/2006 Findings: The lungs are clear. Mediastinal contours appear stable. The heart is within normal limits in size. No bony abnormality is seen. Supine and erect views of the abdomen show dilatation of small bowel loops with scattered air-fluid levels, suspicious for partial small bowel obstruction. Only a small amount of colonic bowel gas is seen with  feces throughout the colon. No free air is noted on the erect view. No opaque calculi are seen. IMPRESSION: 1. Suspect partial small bowel obstruction. Consider CT of the abdomen and pelvis with IV contrast media. No free air. 2. No active lung disease. Original Report Authenticated By: Juline Patch, M.D.  Microbiology:  No results found for this or any previous visit (from the past 240 hour(s)).  Labs:    Results for orders placed during the hospital encounter of 11/28/11 (from the past 48 hour(s))    CARDIAC PANEL(CRET KIN+CKTOT+MB+TROPI) Status: Normal     Collection Time     11/29/11 12:40 PM    Component  Value  Range  Comment     Total CK  42  7 - 177 U/L      CK, MB  1.7  0.3 - 4.0 ng/mL      Troponin I  <0.30  <0.30 ng/mL      Relative Index  RELATIVE INDEX IS INVALID  0.0 - 2.5      HPI 49:year old female who presents with projectile bilious emesis and abdominal pain for 2-3 days. She has chronic low back pain and is on narcotics. Over the last 2 weeks, she has had significant constipation. She states that she tried laxatives, but these did not help. The abdominal pain was severe yesterday, but has improved. She has never had abdominal surgery. She had difficulty drinking CT contrast. She has never had colonoscopy. She has not had symptoms like this before.  HOSPITAL COURSE:  #1 partial small bowel obstruction, surgery consulted. Patient treated with SMOG enema and Dulcolax suppositories with good response, when narcotics were minimized. She had a nasogastric tube placed for a couple of days. Nasogastric tube was clamped yesterday for 5 hours and then removed. Diet was advanced from clear liquids to mechanical soft diet which she tolerated well.  Surgery recommends screening colonoscopy in the outpatient setting  Discharge Exam:  Blood pressure 155/75, pulse 66, temperature 98 F (36.7 C), temperature source Oral, resp. rate 18, height 5\' 5"  (1.651 m), weight 63.504 kg (140 lb), SpO2  95.00%.  General: Pt awake/alert/oriented x4 in no acute distress  Eyes: PERRL, normal EOM. Sclera clear. No icterus  Neuro: CN II-XII intact w/o focal sensory/motor deficits.  Lymph: No head/neck/groin lymphadenopathy  Psych: No delerium/psychosis/paranoia  HENT: Normocephalic, Mucus membranes moist. No thrush. NGT in place  Neck: Supple, No tracheal deviation  Chest: No chest wall pain w good excursion  CV: Pulses intact. Regular rhythm  Abdomen: Soft. Nondistended. Nontender. No incarcerated hernias.  Ext: SCDs BLE. No mjr edema. No cyanosis  Skin: No petechiae / purpurae    Signed:  Mali Eppard  12/01/2011, 9:46 AM

## 2011-12-01 NOTE — Progress Notes (Signed)
Pt. Discharge orders, meds, signs, and symptoms discussed with pt. And her daughter.  Both are able to verbalize understanding of all discharge instructions.  IV discontinued from left wrist without difficulty.  No redness, drainage, or swelling noted.  Gauze and paper tape placed to site.  Pt. Discharged ambulatory with daughter.  [name] 12/01/2011 3:42 PM

## 2011-12-02 NOTE — Progress Notes (Signed)
Utilization review completed.  

## 2012-09-01 IMAGING — CR DG ABDOMEN 1V
1 series · 1 of 1 positions shown · non-contrast
Comparison: 11/28/2011

CLINICAL DATA: Constipation, possible bowel obstruction

ABDOMEN - 1 VIEW

[ap (kub)]
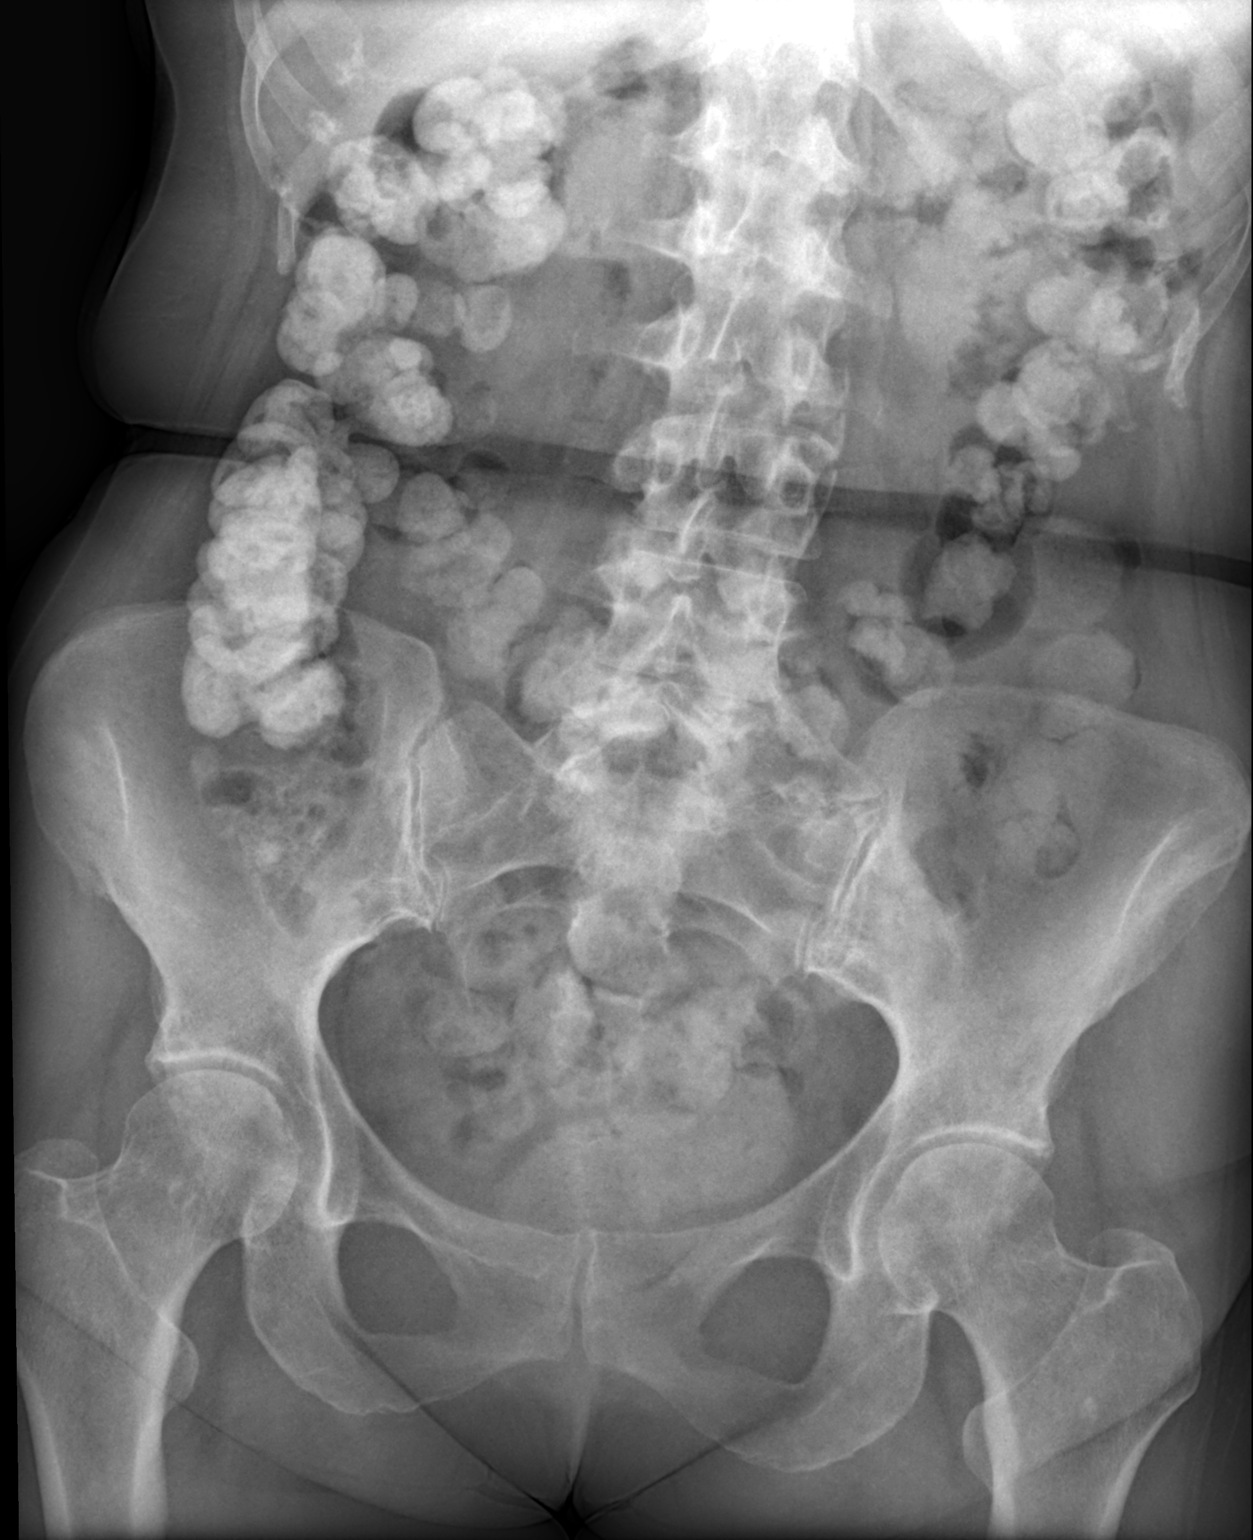

[1 of 1 positions shown; findings below may reference images not displayed]

FINDINGS: Residual oral contrast has migrated into the colon from
yesterday's CT exam.  Scattered air, contrast and stool throughout
the bowel.  Negative for obstruction pattern or significant
dilatation.  No ileus.  Atherosclerosis of the aorta.  Degenerative
changes of the lower lumbar spine.
IMPRESSION: Migration of CT oral contrast into the colon since yesterday.
Negative for obstruction.

## 2014-05-15 ENCOUNTER — Encounter: Payer: Self-pay | Admitting: Gastroenterology

## 2014-05-15 ENCOUNTER — Other Ambulatory Visit (HOSPITAL_COMMUNITY): Payer: Self-pay | Admitting: Family Medicine

## 2014-05-15 DIAGNOSIS — Z1231 Encounter for screening mammogram for malignant neoplasm of breast: Secondary | ICD-10-CM

## 2014-05-16 ENCOUNTER — Ambulatory Visit (HOSPITAL_COMMUNITY)
Admission: RE | Admit: 2014-05-16 | Discharge: 2014-05-16 | Disposition: A | Discharge status: Home / Self Care | Payer: PRIVATE HEALTH INSURANCE | Source: Ambulatory Visit | Attending: Family Medicine | Admitting: Family Medicine

## 2014-05-16 ENCOUNTER — Encounter: Payer: Self-pay | Admitting: Gastroenterology

## 2014-05-16 DIAGNOSIS — Z1231 Encounter for screening mammogram for malignant neoplasm of breast: Secondary | ICD-10-CM | POA: Insufficient documentation

## 2014-06-29 ENCOUNTER — Telehealth: Payer: Self-pay | Admitting: *Deleted

## 2014-06-29 NOTE — Telephone Encounter (Signed)
Noted per Dr Christella HartiganJacobs. movi prep placed.   Hilda LiasMarie PV

## 2014-06-29 NOTE — Telephone Encounter (Signed)
Dr Christella HartiganJacobs,  This lady is coming in for a PV next week , Wednesday 2-24 for a colon 3-9 Wednesday. In her medical history it states chronic constipation and she has a past history of a small partial bowel obstruction. I was wondering if you would like her to have a 2 day prep. There is not much information in her chart. Please advise, thanks for your time  Elizebeth BrookingMarie PV

## 2014-06-29 NOTE — Telephone Encounter (Signed)
Hard to say from limited information in epic.  For direct colonoscopy would plan on regular prep.  If you find at previsit that she has serious constipation (BM only every 5-7 days or worse) then cancel her colonsocoyp and route her to office as new GI patient first.  Thanks

## 2014-07-05 ENCOUNTER — Telehealth: Payer: Self-pay | Admitting: *Deleted

## 2014-07-05 NOTE — Telephone Encounter (Signed)
Patient no show for previsit today. Message left at work to call previsit room 50 and message left at home that previsit will need to be rescheduled by 5pm today or colonoscopy scheduled 07/19/14 will be cancelled and both appointments would need to be rescheduled.

## 2014-07-05 NOTE — Telephone Encounter (Signed)
No show letter sent and appointment for colon cancelled for 07/19/14.

## 2014-07-19 ENCOUNTER — Encounter: Payer: PRIVATE HEALTH INSURANCE | Admitting: Gastroenterology

## 2016-04-15 ENCOUNTER — Other Ambulatory Visit: Payer: Self-pay | Admitting: Family Medicine

## 2016-04-15 DIAGNOSIS — M5431 Sciatica, right side: Secondary | ICD-10-CM

## 2023-04-20 DIAGNOSIS — R0602 Shortness of breath: Secondary | ICD-10-CM | POA: Diagnosis not present

## 2023-04-20 DIAGNOSIS — Z131 Encounter for screening for diabetes mellitus: Secondary | ICD-10-CM | POA: Diagnosis not present

## 2023-04-20 DIAGNOSIS — I10 Essential (primary) hypertension: Secondary | ICD-10-CM | POA: Diagnosis not present

## 2023-04-20 DIAGNOSIS — Z13220 Encounter for screening for lipoid disorders: Secondary | ICD-10-CM | POA: Diagnosis not present

## 2023-04-20 DIAGNOSIS — Z720 Tobacco use: Secondary | ICD-10-CM | POA: Diagnosis not present

## 2023-05-04 DIAGNOSIS — D582 Other hemoglobinopathies: Secondary | ICD-10-CM | POA: Diagnosis not present

## 2023-05-04 DIAGNOSIS — Z720 Tobacco use: Secondary | ICD-10-CM | POA: Diagnosis not present

## 2023-05-04 DIAGNOSIS — E782 Mixed hyperlipidemia: Secondary | ICD-10-CM | POA: Diagnosis not present

## 2023-05-04 DIAGNOSIS — R0602 Shortness of breath: Secondary | ICD-10-CM | POA: Diagnosis not present

## 2023-05-04 DIAGNOSIS — J4542 Moderate persistent asthma with status asthmaticus: Secondary | ICD-10-CM | POA: Diagnosis not present

## 2023-05-04 DIAGNOSIS — N289 Disorder of kidney and ureter, unspecified: Secondary | ICD-10-CM | POA: Diagnosis not present

## 2023-05-04 DIAGNOSIS — I10 Essential (primary) hypertension: Secondary | ICD-10-CM | POA: Diagnosis not present

## 2023-08-28 DIAGNOSIS — J4542 Moderate persistent asthma with status asthmaticus: Secondary | ICD-10-CM | POA: Diagnosis not present

## 2023-08-28 DIAGNOSIS — Z0001 Encounter for general adult medical examination with abnormal findings: Secondary | ICD-10-CM | POA: Diagnosis not present

## 2023-08-28 DIAGNOSIS — Z720 Tobacco use: Secondary | ICD-10-CM | POA: Diagnosis not present

## 2023-08-28 DIAGNOSIS — I10 Essential (primary) hypertension: Secondary | ICD-10-CM | POA: Diagnosis not present

## 2023-08-28 DIAGNOSIS — E782 Mixed hyperlipidemia: Secondary | ICD-10-CM | POA: Diagnosis not present

## 2023-08-28 DIAGNOSIS — N289 Disorder of kidney and ureter, unspecified: Secondary | ICD-10-CM | POA: Diagnosis not present

## 2024-01-07 ENCOUNTER — Other Ambulatory Visit: Payer: Self-pay | Admitting: Physician Assistant

## 2024-01-07 DIAGNOSIS — J4542 Moderate persistent asthma with status asthmaticus: Secondary | ICD-10-CM | POA: Diagnosis not present

## 2024-01-07 DIAGNOSIS — Z1231 Encounter for screening mammogram for malignant neoplasm of breast: Secondary | ICD-10-CM

## 2024-01-07 DIAGNOSIS — I10 Essential (primary) hypertension: Secondary | ICD-10-CM | POA: Diagnosis not present

## 2024-01-07 DIAGNOSIS — E782 Mixed hyperlipidemia: Secondary | ICD-10-CM | POA: Diagnosis not present

## 2024-01-07 DIAGNOSIS — Z720 Tobacco use: Secondary | ICD-10-CM | POA: Diagnosis not present

## 2024-03-16 ENCOUNTER — Ambulatory Visit
Admission: RE | Admit: 2024-03-16 | Discharge: 2024-03-16 | Disposition: A | Discharge status: Home / Self Care | Payer: PRIVATE HEALTH INSURANCE | Source: Ambulatory Visit | Attending: Physician Assistant | Admitting: Physician Assistant

## 2024-03-16 DIAGNOSIS — Z1231 Encounter for screening mammogram for malignant neoplasm of breast: Secondary | ICD-10-CM
